# Patient Record
Sex: Male | Born: 1990
Health system: Southern US, Community
[De-identification: ages and names within clinical notes are randomized; demographics above are authoritative.]

## PROBLEM LIST (undated history)

## (undated) HISTORY — PX: SHOULDER SURGERY: SHX246

---
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
## 2008-06-04 ENCOUNTER — Ambulatory Visit (HOSPITAL_COMMUNITY): Admission: RE | Admit: 2008-06-04 | Discharge: 2008-06-04 | Payer: Self-pay | Admitting: Orthopedic Surgery

## 2011-05-02 NOTE — Op Note (Signed)
Ryan Kramer, Ryan Kramer               ACCOUNT NO.:  [id number]   MEDICAL RECORD NO.:  [id number]          PATIENT TYPE:  AMB   LOCATION:  SDS                          FACILITY:  [facility]   PHYSICIAN:  Vania Rea. Supple, M.D.  DATE OF BIRTH:  05-Jan-1991   DATE OF PROCEDURE:  08/08/2007  DATE OF DISCHARGE:                               OPERATIVE REPORT   PREOPERATIVE DIAGNOSIS:  Recurrent right shoulder instability, question  posterior verus anterior versus multidirectional instability.   POSTOPERATIVE DIAGNOSES:  1. Recurrent right shoulder instability (anterior).  2. Partial articular rotator cuff tear.   PROCEDURE:  1. Right shoulder examination under anesthesia.  2. Right shoulder diagnostic arthroscopy.  3. Arthroscopic Bankart repair.  4. Debridement of partial articular rotator cuff tear.   SURGEON:  Vania Rea. Supple, M.D.   ASSISTANTFrench Ana Shuford, P.A.-C.   ANESTHESIA:  General endotracheal as well as a preoperative interscalene  block.   BLOOD LOSS:  Minimal.   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  His examination has shown questionable  anterior versus posterior instability versus MDI.  He played all last  year with bilateral shoulder harnesses and did relatively well but is  now having increasing difficulties with bilateral shoulder instability.  The right is more symptomatic.  He is brought to the operating room at  this time for planned right shoulder arthroscopic stabilization.   We preoperatively counseled Ryan Kramer and his mother on treatment options  as well as the risks versus benefits thereof.  Possible surgical  complications of bleeding, infection, neurovascular injury, persistent  pain, loss of motion, anesthetic complications, recurrence of  instability, and possible need for additional surgery were reviewed.  They understand and  accept and agree with our planned procedure.   PROCEDURE IN DETAIL:  After undergoing routine preoperative evaluation,  the patient received prophylactic antibiotics.  An interscalene block  was established in the holding area by the anesthesia department.  He  was placed supine on the operating table and underwent smooth induction  of a general endotracheal anesthesia.  He was turned to the left lateral  decubitus position on a beanbag and appropriately padded and protected.   Right shoulder examination under anesthesia showed posterior translation  at the upper limits of normal, but there was no gross posterior  instability.  There was, however, obvious anterior instability with  ability to easily dislocate the shoulder anteriorly on subsequent  reduction maneuver.  The right arm was then suspended at 70 degrees of  abduction with 10 pounds of traction.  The right shoulder girdle region  was then sterilely prepped and draped in standard fashion.  A posterior  portal was established in the glenohumeral joint and an anterior portal  was established under direct visualization.  The glenohumeral articular  surfaces showed areas of chondromalacia and chondral injury over the  posterior and superior aspect of the humeral head consistent with a Hill-  Sachs lesion, although there was no obvious indentation on the humeral  head.  The glenoid was in good condition, although there was evidence  for attenuation along the anterior margin of the glenoid, and the  anterior-inferior labrum was markedly deficient.  In addition, the  anterior- inferior glenohumeral ligaments were significantly attenuated,  stretched, and there was a very capacious anterior inferior pouch.  The humeral head readily dislocated anteriorly.  There was no obvious  posterior instability, although again, the capsular volume was at the  upper limits of normal.  There was a large pedunculated fragment of torn  anterior labral  tissue which was debrided with a shaver.  The biceps  tendon was of normal caliber and good-quality tissue.  There was no  obvious instability of the biceps anchor.  Inspection of the rotator  cuff showed an area of fraying on the articular side involving the  distal insertion of the supraspinatus.  This was a partial-thickness  tear comprising approximately 5 to 10% of the thickness of the tendon.  This area was debrided with a shaver.  The posterior and anterior  aspects of the rotator cuff were in good condition.  Probing of the  anterior labrum showed that it had attenuated and was detached from 2  o'clock down to 6 o'clock.  It had scarified medially on the glenoid  neck.  With this finding, we proceeded with a Bankart repair.  A  periosteal elevator was introduced and used to elevate the anterior  labrum and adjacent aspects of the anterior inferior glenohumeral  ligaments away from the glenoid rim and neck.  It was completely  mobilized.  A bur was then introduced and used to abrade the anterior  margin of the glenoid face from 2 o'clock down to 6 o'clock.  The shaver  was then used to remove all residual bony and soft tissue debris.  An  accessory anterior inferior portal was then established through the  upper margin of the subscapularis.  We placed a series of 3 Arthrex Bio-  FASTak suture anchors at the 5:30, 4:30, and 3 o'clock position.  The  suture limbs were then passed through the adjacent margins of the  anterior-inferior glenohumeral ligament utilizing initially a suture  shuttle technique with the corkscrew suture lasso, and this allowed [place]  to grab suture from very inferiorly and bring it up to the glenoid bone  trough.  The suture limbs were passed in a horizontal mattress construct  and then tied with sliding locking knots followed by multiple overhand  throws and alternating posts.  This allowed a nice re-establishment of  the anterior labral bumper and also reduce  the redundant anterior  capsular volume, and this also helped to center the humeral head over  the glenoid which was nicely concentric at the completion of the  procedure.  Final debridement was performed with the shaver.  At this  point, the fluid and instrument were then removed.  The ports were  closed with Monocryl and Steri-Strips.  A bulky dry dressing taped out  the right shoulder.  The right arm was placed in a sling immobilizer.   The patient was placed supine, extubated, and taken to the recovery room  in stable condition.      Vania Rea. Supple, M.D.  Electronically Signed     KMS/MEDQ  D:  08/08/2007  T:  08/09/2007  Job:  [id number]

## 2011-05-02 NOTE — Op Note (Signed)
NAMESHAHEEM, Ryan Kramer               ACCOUNT NO.:  [id number]   MEDICAL RECORD NO.:  [id number]          PATIENT TYPE:  AMB   LOCATION:  SDS                          FACILITY:  [facility]   PHYSICIAN:  Vania Rea. Supple, M.D.  DATE OF BIRTH:  [birth year]   DATE OF PROCEDURE:  06/04/2008  DATE OF DISCHARGE:  06/04/2008                               OPERATIVE REPORT   PREOPERATIVE DIAGNOSIS:  Recurrent left shoulder anterior instability.   POSTOPERATIVE DIAGNOSES:  1. Recurrent left shoulder anterior instability.  2. Partial articular rotator cuff tear.   PROCEDURE:  1. Left shoulder examination under anesthesia.  2. Left shoulder diagnostic arthroscopy.  3. Arthroscopic Bankart repair.  4. Debridement partial articular rotator cuff tear.   SURGEON:  Vania Rea. Supple, MD   ASSISTANT:  Lucita Lora. Shuford, PA-C.   ANESTHESIA:  General endotracheal as well as preoperative scalene block.   ESTIMATED BLOOD LOSS:  Minimal.   DRAINS:  None.   HISTORY:  Ryan Kramer is an 20 year old male who has had difficulties with  recurrent bilateral shoulder instability, underwent a right shoulder  arthroscopic stabilization that I performed just over a year ago.  He  has done very well and is now brought to the operating room for planned  left shoulder arthroscopic stabilization.   Preoperatively, I counseled Ryan Kramer and his family on treatment options  as well as risks versus benefits thereof.  Possible surgical  complications, bleeding, infection, neurovascular injury, persistent  pain, loss of motion, recurrent instability, and possible need for  additional surgery were reviewed.  They understands, accepts, and agrees  with our planned procedure.   PROCEDURE IN DETAIL:  After undergoing routine preop evaluation, the  patient received prophylactic antibiotics.  Interscalene block was  established in a holding area by the Anesthesia Department.  Placed  supine on the operative table and underwent smooth  induction of a  general endotracheal anesthesia.  Turned to the right lateral decubitus  position on a beanbag and appropriately padded and protected.  Left  shoulder examination under anesthesia revealed anterior instability.  Shoulder mobility was at the limits of normal, but no obvious pathologic  posterior instability.  Left arm suspended at 70 degrees of abduction  with 15 pounds of traction.  Left shoulder girdle region was then  sterilely prepped and draped in standard fashion.  Posterior portal was  established in the glenohumeral joint and anterior portal established  under direct visualization.  The glenohumeral articular surfaces were in  excellent condition.  The capsular volume posteriorly was the upper  limits of normal, but there was no obvious pathologic posterior or  inferior instability.  There was a combined anterior inferior  instability and an obvious Bankart lesion with disruption and detachment  of the anterior inferior quadrant of the labrum and deficiency of the  anterior band at the inferior labral ligament consistent with classic  Bankart lesion.  In addition, we found evidence for partial articular  rotator cuff tear involving the distal supraspinatus.  This area was  debrided with a shaver to healthy-appearing tissue.  The estimated  involved less than 5-10%  thickness of the rotator cuff tendon.  The  superior labrum was carefully inspected and found to be stable and the  biceps tendon showed normal caliber, good quality tissue.  This point,  we proceed with a Bankart repair, beginning with a periosteal elevator  to elevate and free up the anterior-inferior quadrant of the labrum and  attached glenohumeral ligaments.  These were completely mobilized from  10 o'clock down to 6 o'clock.  We then used a bur to ablate the anterior  margin of the glenoid face from 10 o'clock down to 6 o'clock and then  debrided soft tissues with a shaver.  We then established an  accessory  anterior inferior cannula through the upper margin of the subscapularis.  We then placed a series of 3 Arthrex PEEK suture tacks at the 7 o'clock,  8:30 and 10 o'clock position.  The limbs of the suture anchor were then  passed through the adjacent aspects of the labrum and glenohumeral  ligaments beginning with the inferior pair which was placed in such a  way as to allow a superior ward shift of the redundant axillary pouch  and also reestablish a anterior labral bumper and the suture limbs  were passed in a horizontal mattress construct, then tied with sliding  and locking knots followed by multiple overhand throws and alternating  posts.  Once the sutures were placed and completed this nicely  eliminating redundant capsular volume inferiorly and anteriorly and  reestablish an excellent anterior capsule bumper.  At this point,  final inspection, irrigation, and debridement was then completed.  Fluid  and instruments were removed.  The portals were closed with Monocryl and  Steri-Strips.  A bulky dry dressing was then taped over the left total  shoulder, left arm placed in sling immobilizer.  The patient was then  placed supine, extubated, and taken to recovery room in stable  condition.      Vania Rea. Supple, M.D.  Electronically Signed     KMS/MEDQ  D:  06/04/2008  T:  06/05/2008  Job:  [id number]

## 2011-09-14 LAB — COMPREHENSIVE METABOLIC PANEL WITH GFR
ALT: 17
AST: 17
Albumin: 4.1
Alkaline Phosphatase: 74
BUN: 11
CO2: 28
Calcium: 9.5
Chloride: 107
Creatinine, Ser: 1.09
GFR calc Af Amer: >60
GFR calc non Af Amer: >60
Glucose, Bld: 98
Potassium: 4
Sodium: 141
Total Bilirubin: 1
Total Protein: 6.7

## 2011-09-14 LAB — CBC
HCT: 44.3
Hemoglobin: 15.3
MCHC: 34.6
MCV: 90.3
Platelets: 183
RBC: 4.91
RDW: 12.5
WBC: 9.1

## 2011-09-14 LAB — PROTIME-INR
INR: 1.1
Prothrombin Time: 14.9

## 2011-09-14 LAB — URINALYSIS, ROUTINE W REFLEX MICROSCOPIC
Nitrite: NEGATIVE
Protein, ur: NEGATIVE
Specific Gravity, Urine: 1.024
Urobilinogen, UA: 1

## 2011-09-14 LAB — APTT: aPTT: 30

## 2011-09-29 LAB — DIFFERENTIAL
Basophils Absolute: 0
Basophils Relative: 0
Neutro Abs: 5.1
Neutrophils Relative %: 64

## 2011-09-29 LAB — COMPREHENSIVE METABOLIC PANEL
Alkaline Phosphatase: 88
BUN: 11
Chloride: 104
Glucose, Bld: 89
Potassium: 3.9
Total Bilirubin: 0.7
Total Protein: 7.1

## 2011-09-29 LAB — PROTIME-INR
INR: 1.1
Prothrombin Time: 14

## 2011-09-29 LAB — CBC
RBC: 4.92
RDW: 12.4
WBC: 7.9

## 2011-09-29 LAB — URINALYSIS, ROUTINE W REFLEX MICROSCOPIC
Bilirubin Urine: NEGATIVE
Ketones, ur: NEGATIVE
Nitrite: NEGATIVE
Urobilinogen, UA: 0.2

## 2011-09-29 LAB — APTT: aPTT: 28

## 2012-02-07 ENCOUNTER — Emergency Department (INDEPENDENT_AMBULATORY_CARE_PROVIDER_SITE_OTHER): Payer: BC Managed Care – PPO

## 2012-02-07 ENCOUNTER — Encounter (HOSPITAL_COMMUNITY): Payer: Self-pay

## 2012-02-07 ENCOUNTER — Emergency Department (HOSPITAL_COMMUNITY)
Admission: EM | Admit: 2012-02-07 | Discharge: 2012-02-07 | Disposition: A | Discharge status: Home Health | Payer: BC Managed Care – PPO | Source: Home / Self Care

## 2012-02-07 DIAGNOSIS — S93409A Sprain of unspecified ligament of unspecified ankle, initial encounter: Secondary | ICD-10-CM

## 2012-02-07 MED ORDER — IBUPROFEN 800 MG PO TABS: 800.0000 mg | ORAL_TABLET | Freq: Three times a day (TID) | ORAL | Status: AC

## 2012-02-07 NOTE — ED Notes (Signed)
States he turned his foot last PM, and has pain lateral aspect of right foot ; denies pain ankle, good posterior tibial and dorsal pedal aspect of injured foot , ambulates on crutches, no significant swelling appreciated on exam

## 2012-02-07 NOTE — Discharge Instructions (Signed)
Ice your ankle and foot 3-4 times a day for 15-20 mintues. Elevate also to help with swelling and discomfort. Use crutches as needed. You can begin to bear weight on your foot as tolerated. If you are not noticing improvement in one week follow up with Dr Renae Gloss.

## 2012-02-07 NOTE — ED Provider Notes (Signed)
Medical screening examination/treatment/procedure(s) were performed by non-physician practitioner and as supervising physician I was immediately available for consultation/collaboration.  Corrie Mckusick, MD 02/07/12 [phone]

## 2012-02-07 NOTE — ED Provider Notes (Signed)
History     CSN: [id number]  Arrival date & time 02/07/12  4098   None     Chief Complaint  Patient presents with  . Foot Injury    (Consider location/radiation/quality/duration/timing/severity/associated sxs/prior treatment) HPI Comments: Patient presents today with complaints of right lateral foot pain. He states he was playing basketball last night, when he jumped and landed rolling his right ankle. Pain worsens with weight bearing. He is using crutches. He also notes swelling of his right foot.    History reviewed. No pertinent past medical history.  History reviewed. No pertinent past surgical history.  History reviewed. No pertinent family history.  History  Substance Use Topics  . Smoking status: Never Smoker   . Smokeless tobacco: Not on file  . Alcohol Use: Yes      Review of Systems  Musculoskeletal: Negative for joint swelling.  Skin: Negative for color change and wound.  Neurological: Negative for weakness and numbness.    Allergies  Review of patient's allergies indicates no known allergies.  Home Medications   Current Outpatient Rx  Name Route Sig Dispense Refill  . IBUPROFEN 800 MG PO TABS Oral Take 1 tablet (800 mg total) by mouth 3 (three) times daily. 15 tablet 0    BP 134/76  Pulse 67  Temp(Src) 98.1 F (36.7 C) (Oral)  Resp 17  SpO2 97%  Physical Exam  Nursing note and vitals reviewed. Constitutional: He appears well-developed and well-nourished. No distress.  HENT:  Head: Normocephalic and atraumatic.  Right Ear: Tympanic membrane and ear canal normal.  Left Ear: Tympanic membrane and ear canal normal.  Mouth/Throat: Uvula is midline and mucous membranes are normal. No posterior oropharyngeal edema or posterior oropharyngeal erythema.  Musculoskeletal:       Right ankle: He exhibits normal range of motion, no swelling, no ecchymosis, no deformity and normal pulse. tenderness. AITFL and head of 5th metatarsal tenderness found. No  lateral malleolus, no medial malleolus, no CF ligament, no posterior TFL and no proximal fibula tenderness found. Achilles tendon normal.       Right foot: He exhibits bony tenderness. He exhibits normal range of motion, no tenderness, no swelling, normal capillary refill, no crepitus, no deformity and no laceration.  Neurological: He is alert.  Skin: Skin is warm and dry.  Psychiatric: He has a normal mood and affect.    ED Course  Procedures (including critical care time)  Labs Reviewed - No data to display Dg Foot Complete Right  02/07/2012  *RADIOLOGY REPORT*  Clinical Data: 21 year old male with twisting injury and pain at the lateral foot.  RIGHT FOOT COMPLETE - 3+ VIEW  Comparison: None.  Findings: Calcaneus intact.  Joint spaces within normal limits.  No acute fracture or dislocation. Bone mineralization is within normal limits.  IMPRESSION: No acute fracture or dislocation identified about the right foot.  Original Report Authenticated By: Ulla Potash III, M.D.     1. Ankle sprain       MDM  Xray neg.         Melody Comas, [place] 02/07/12 1324

## 2014-12-14 ENCOUNTER — Encounter (HOSPITAL_COMMUNITY): Payer: Self-pay

## 2014-12-14 ENCOUNTER — Emergency Department (INDEPENDENT_AMBULATORY_CARE_PROVIDER_SITE_OTHER)
Admission: EM | Admit: 2014-12-14 | Discharge: 2014-12-14 | Disposition: A | Discharge status: Home / Self Care | Payer: 59 | Source: Home / Self Care | Attending: Family Medicine | Admitting: Family Medicine

## 2014-12-14 DIAGNOSIS — R519 Headache, unspecified: Secondary | ICD-10-CM

## 2014-12-14 DIAGNOSIS — R51 Headache: Secondary | ICD-10-CM

## 2014-12-14 MED ORDER — KETOROLAC TROMETHAMINE 60 MG/2ML IM SOLN
INTRAMUSCULAR | Status: AC
  Filled 2014-12-14: qty 2

## 2014-12-14 MED ORDER — DIPHENHYDRAMINE HCL 25 MG PO CAPS
50.0000 mg | ORAL_CAPSULE | Freq: Once | ORAL | Status: AC
  Administered 2014-12-14: 50 mg via ORAL

## 2014-12-14 MED ORDER — METHYLPREDNISOLONE SODIUM SUCC 125 MG IJ SOLR
125.0000 mg | Freq: Once | INTRAMUSCULAR | Status: AC
  Administered 2014-12-14: 125 mg via INTRAMUSCULAR

## 2014-12-14 MED ORDER — DIPHENHYDRAMINE HCL 25 MG PO CAPS
ORAL_CAPSULE | ORAL | Status: AC
  Filled 2014-12-14: qty 2

## 2014-12-14 MED ORDER — KETOROLAC TROMETHAMINE 60 MG/2ML IM SOLN
60.0000 mg | Freq: Once | INTRAMUSCULAR | Status: AC
  Administered 2014-12-14: 60 mg via INTRAMUSCULAR

## 2014-12-14 MED ORDER — METHYLPREDNISOLONE SODIUM SUCC 125 MG IJ SOLR
INTRAMUSCULAR | Status: AC
  Filled 2014-12-14: qty 2

## 2014-12-14 MED ORDER — METOCLOPRAMIDE HCL 5 MG/ML IJ SOLN
INTRAMUSCULAR | Status: AC
  Filled 2014-12-14: qty 2

## 2014-12-14 MED ORDER — METOCLOPRAMIDE HCL 5 MG/ML IJ SOLN
10.0000 mg | Freq: Once | INTRAMUSCULAR | Status: AC
  Administered 2014-12-14: 10 mg via INTRAMUSCULAR

## 2014-12-14 NOTE — ED Provider Notes (Addendum)
CSN: [id number]     Arrival date & time 12/14/14  1854 History   First MD Initiated Contact with Patient 12/14/14 1925     Chief Complaint  Patient presents with  . Headache   (Consider location/radiation/quality/duration/timing/severity/associated sxs/prior Treatment) Patient is a 23 y.o. male presenting with headaches. The history is provided by the patient. No language interpreter was used.  Headache Pain location:  Frontal Radiates to:  Does not radiate Severity currently:  6/10 Severity at highest:  6/10 Onset quality:  Gradual Duration:  3 days Timing:  Constant Progression:  Worsening Chronicity:  New Similar to prior headaches: no   Context: not eating   Relieved by:  Nothing Worsened by:  Nothing tried Ineffective treatments:  None tried Associated symptoms: nausea   Risk factors: no anger and no family hx of SAH     History reviewed. No pertinent past medical history. History reviewed. No pertinent past surgical history. History reviewed. No pertinent family history. History  Substance Use Topics  . Smoking status: Never Smoker   . Smokeless tobacco: Not on file  . Alcohol Use: Yes    Review of Systems  Gastrointestinal: Positive for nausea.  Neurological: Positive for headaches.  All other systems reviewed and are negative.   Allergies  Review of patient's allergies indicates no known allergies.  Home Medications   Prior to Admission medications   Not on File   BP 139/78 mmHg  Pulse 74  Temp(Src) 98.6 F (37 C) (Oral)  Resp 16  SpO2 98% Physical Exam  Constitutional: He is oriented to person, place, and time. He appears well-developed and well-nourished.  HENT:  Head: Normocephalic.  Right Ear: External ear normal.  Left Ear: External ear normal.  Mouth/Throat: Oropharynx is clear and moist.  Eyes: Conjunctivae and EOM are normal. Pupils are equal, round, and reactive to light.  Neck: Normal range of motion.  Cardiovascular: Normal rate  and normal heart sounds.   Pulmonary/Chest: Effort normal and breath sounds normal.  Abdominal: Soft. He exhibits no distension.  Musculoskeletal: Normal range of motion.  Neurological: He is alert and oriented to person, place, and time.  Psychiatric: He has a normal mood and affect.  Nursing note and vitals reviewed.   ED Course  Procedures (including critical care time) Labs Review Labs Reviewed - No data to display  Imaging Review No results found.   MDM  Pt given Wake Migraine cocktail, reglan 10 mg, torodol 60 mg, solumedrol125  and benadryl 25. Pt advised home to sleep.  To ED if symptoms worsen or cahnge   1. Headache, unspecified headache type        Elson AreasLeslie K Jamahl Lemmons, PA-C 12/14/14 [address]2008  Almeta Geisel K Wilton ManorsSofia, [place] JerseyPA-C 01/05/15 1202

## 2014-12-14 NOTE — ED Notes (Signed)
3rd day duration of HA. No relief w motrin . Has a driver. Looks uncomfortable

## 2014-12-14 NOTE — Discharge Instructions (Signed)

## 2015-05-28 ENCOUNTER — Encounter (HOSPITAL_COMMUNITY): Payer: Self-pay | Admitting: Emergency Medicine

## 2015-05-28 ENCOUNTER — Emergency Department (HOSPITAL_COMMUNITY)
Admission: EM | Admit: 2015-05-28 | Discharge: 2015-05-28 | Disposition: A | Discharge status: Home / Self Care | Payer: 59 | Source: Home / Self Care | Attending: Emergency Medicine | Admitting: Emergency Medicine

## 2015-05-28 ENCOUNTER — Emergency Department (INDEPENDENT_AMBULATORY_CARE_PROVIDER_SITE_OTHER): Payer: 59

## 2015-05-28 DIAGNOSIS — S63502A Unspecified sprain of left wrist, initial encounter: Secondary | ICD-10-CM

## 2015-05-28 MED ORDER — HYDROCODONE-ACETAMINOPHEN 5-325 MG PO TABS: 1.0000 | ORAL_TABLET | Freq: Four times a day (QID) | ORAL | Status: DC | PRN

## 2015-05-28 MED ORDER — IBUPROFEN 800 MG PO TABS: 800.0000 mg | ORAL_TABLET | Freq: Three times a day (TID) | ORAL | Status: DC | PRN

## 2015-05-28 NOTE — Discharge Instructions (Signed)
Your x-ray is normal. You have sprained your wrist. Wear the brace during the day. Remove it several times and do gentle range of motion. Continue to ice the wrist. Take ibuprofen 800 mg 3 times a day for the next 5 days, then as needed. Use the Norco every 4-6 hours as needed for severe pain. Do not drive while taking this medicine. If there is no improvement in 1 week, please follow-up with your orthopedic doctor.

## 2015-05-28 NOTE — ED Provider Notes (Signed)
CSN: [id number]     Arrival date & time 05/28/15  1634 History   First MD Initiated Contact with Patient 05/28/15 1640     Chief Complaint  Patient presents with  . Wrist Injury   (Consider location/radiation/quality/duration/timing/severity/associated sxs/prior Treatment) HPI  He is a 24 year old man here for evaluation of left wrist injury. 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 He landed on his outstretched left hand. He reports immediate pain and swelling in the wrist. The pain is worse on the ulnar side of the wrist. He has been applying ice, which has helped the swelling. He has tried ibuprofen and hydrocodone with minimal improvement.  History reviewed. No pertinent past medical history. History reviewed. No pertinent past surgical history. No family history on file. History  Substance Use Topics  . Smoking status: Never Smoker   . Smokeless tobacco: Not on file  . Alcohol Use: Yes    Review of Systems As in history of present illness Allergies  Review of patient's allergies indicates no known allergies.  Home Medications   Prior to Admission medications   Medication Sig Start Date End Date Taking? Authorizing Provider  HYDROcodone-acetaminophen (NORCO) 5-325 MG per tablet Take 1 tablet by mouth every 6 (six) hours as needed for moderate pain. 05/28/15   Charm Rings, MD  ibuprofen (ADVIL,MOTRIN) 800 MG tablet Take 1 tablet (800 mg total) by mouth every 8 (eight) hours as needed for moderate pain. 05/28/15   Charm Rings, MD   BP 121/77 mmHg  Pulse 67  Temp(Src) 98.2 F (36.8 C) (Oral)  Resp 16  SpO2 98% Physical Exam  Constitutional: He is oriented to person, place, and time. He appears well-developed and well-nourished. No distress.  Cardiovascular: Normal rate.   Pulmonary/Chest: Effort normal.  Musculoskeletal:  Left wrist: Mild swelling. He is diffusely tender at the wrist, primarily on the dorsal  aspect. He is able to move his fingers, but this causes some pain and discomfort. Any movement of the wrist is painful. He has a 2+ radial pulse.  Neurological: He is alert and oriented to person, place, and time.    ED Course  Procedures (including critical care time) Labs Review Labs Reviewed - No data to display  Imaging Review Dg Wrist Complete Left  05/28/2015   CLINICAL DATA:  Status post fall yesterday with persistent left wrist pain and limited range of motion  EXAM: LEFT WRIST - COMPLETE 3+ VIEW  COMPARISON:  None  FINDINGS: The bones of the left wrist are adequately mineralized. There is no acute fracture nor dislocation. Specific attention to the scaphoid reveals no acute abnormality. There is mild soft tissue swelling dorsally.  IMPRESSION: There is no acute bony abnormality of the left wrist.   Electronically Signed   By: David  [place] M.D.   On: 05/28/2015 17:18     MDM   1. Left wrist sprain, initial encounter    X-ray negative for fracture. Conservative management with brace, ice, ibuprofen, Norco. If no improvement in 1 week, he will follow-up with his orthopedic doctor.    Charm Rings, MD 05/28/15 [phone]

## 2015-05-28 NOTE — ED Notes (Signed)
Patient c/o fall yesterday through a outdoor ramp. Patient reports that he has pain in his left wrist. And also has abrasions on his right knee. Patient is in NAD.

## 2016-09-01 MED FILL — LEVOCETIRIZINE 5 MG TABLET: 5 | 30 days supply | Qty: 30 | Fill #0

## 2016-09-04 ENCOUNTER — Ambulatory Visit
Admission: RE | Admit: 2016-09-04 | Discharge: 2016-09-04 | Disposition: A | Discharge status: Home / Self Care | Payer: No Typology Code available for payment source | Source: Ambulatory Visit | Attending: Internal Medicine | Admitting: Internal Medicine

## 2016-09-04 ENCOUNTER — Other Ambulatory Visit: Payer: Self-pay | Admitting: Internal Medicine

## 2016-09-04 DIAGNOSIS — R05 Cough: Secondary | ICD-10-CM

## 2016-09-04 DIAGNOSIS — R053 Chronic cough: Secondary | ICD-10-CM

## 2019-09-10 ENCOUNTER — Other Ambulatory Visit: Payer: Self-pay

## 2019-09-10 DIAGNOSIS — Z20822 Contact with and (suspected) exposure to covid-19: Secondary | ICD-10-CM

## 2019-09-12 LAB — NOVEL CORONAVIRUS, NAA: SARS-CoV-2, NAA: NOT DETECTED

## 2019-09-18 ENCOUNTER — Other Ambulatory Visit: Payer: Self-pay

## 2019-09-18 ENCOUNTER — Ambulatory Visit
Admission: EM | Admit: 2019-09-18 | Discharge: 2019-09-18 | Disposition: A | Discharge status: Home / Self Care | Payer: Self-pay | Attending: Emergency Medicine | Admitting: Emergency Medicine

## 2019-09-18 DIAGNOSIS — R42 Dizziness and giddiness: Secondary | ICD-10-CM

## 2019-09-18 DIAGNOSIS — R002 Palpitations: Secondary | ICD-10-CM

## 2019-09-18 LAB — POCT FASTING CBG KUC MANUAL ENTRY: POCT Glucose (KUC): 106 mg/dL — AB (ref 70–99)

## 2019-09-18 MED ORDER — MECLIZINE HCL 25 MG PO TABS: 25.0000 mg | ORAL_TABLET | Freq: Three times a day (TID) | ORAL | 0 refills | Status: DC | PRN

## 2019-09-18 NOTE — ED Triage Notes (Signed)
Pt presents to UC w/ c/o dizzy spells, cold chills, feeling weak, excessive sweating at night x1 week. Pt reports it occurs in middle of day as well.

## 2019-09-18 NOTE — ED Provider Notes (Signed)
[facility]    CSN: [id number] Arrival date & time: 09/18/19  1036      History   Chief Complaint Chief Complaint  Patient presents with  . Dizziness    HPI Ryan Kramer is a 28 y.o. male presenting for 1 week course of intermittent, paroxysmal dizziness, palpitations, cold chills, generalized weakness, night sweats.  Patient denies recent illness, fever, known sick contacts, recent travel, bug/animal bites.  No URI, GI, cardiopulmonary symptoms.  Patient is unable to articulate exacerbating, alleviating symptoms.  Overall, history is limited second to patient's scattered history, though symptoms do not appear to overlap.  Patient denies history of chronic medical disease.    History reviewed. No pertinent past medical history.  There are no active problems to display for this patient.   Past Surgical History:  Procedure Laterality Date  . SHOULDER SURGERY         Home Medications    Prior to Admission medications   Medication Sig Start Date End Date Taking? Authorizing Provider  meclizine (ANTIVERT) 25 MG tablet Take 1 tablet (25 mg total) by mouth 3 (three) times daily as needed for dizziness. 09/18/19   Hall-Potvin, [place], PA-C    Family History Family History  Problem Relation Age of Onset  . Hypertension Mother   . Hypertension Father     Social History Social History   Tobacco Use  . Smoking status: Current Every Day Smoker    Types: Cigars  . Smokeless tobacco: Never Used  . Tobacco comment: once a week  Substance Use Topics  . Alcohol use: Yes    Comment: 2-3 drinks a week  . Drug use: No     Allergies   Patient has no known allergies.   Review of Systems Review of Systems  Constitutional: Positive for chills. Negative for activity change, appetite change, diaphoresis, fatigue, fever and unexpected weight change.  HENT: Negative for congestion, dental problem, ear pain, facial swelling, hearing loss, sinus pain, sore  throat, trouble swallowing and voice change.   Eyes: Negative for photophobia, pain and visual disturbance.  Respiratory: Negative for cough, shortness of breath and wheezing.   Cardiovascular: Negative for chest pain, palpitations and leg swelling.  Gastrointestinal: Negative for abdominal pain, diarrhea, nausea and vomiting.  Musculoskeletal: Negative for arthralgias, back pain, myalgias and neck pain.  Skin: Negative for rash and wound.  Neurological: Positive for dizziness. Negative for tremors, seizures, syncope, facial asymmetry, speech difficulty, weakness, light-headedness, numbness and headaches.  Hematological: Negative for adenopathy. Does not bruise/bleed easily.  Psychiatric/Behavioral: Negative for agitation and sleep disturbance. The patient is not nervous/anxious.   All other systems reviewed and are negative.    Physical Exam Triage Vital Signs ED Triage Vitals  Enc Vitals Group     BP 09/18/19 1047 119/87     Pulse Rate 09/18/19 1047 87     Resp 09/18/19 1047 16     Temp 09/18/19 1047 97.9 F (36.6 C)     Temp Source 09/18/19 1047 Oral     SpO2 09/18/19 1047 95 %     Weight --      Height --      Head Circumference --      Peak Flow --      Pain Score 09/18/19 1051 2     Pain Loc --      Pain Edu? --      Excl. in [place]? --    No data found.  Updated Vital Signs BP  119/87 (BP Location: Left Arm)   Pulse 87   Temp 97.9 F (36.6 C) (Oral)   Resp 16   SpO2 95%    Physical Exam Constitutional:      General: He is not in acute distress.    Appearance: He is obese. He is not ill-appearing.  HENT:     Head: Normocephalic and atraumatic.     Right Ear: Tympanic membrane, ear canal and external ear normal.     Left Ear: Tympanic membrane, ear canal and external ear normal.     Nose: Nose normal.     Mouth/Throat:     Mouth: Mucous membranes are moist.     Pharynx: Oropharynx is clear.  Eyes:     General: No scleral icterus.    Pupils: Pupils are equal,  round, and reactive to light.  Neck:     Musculoskeletal: Normal range of motion. No muscular tenderness.  Cardiovascular:     Rate and Rhythm: Normal rate and regular rhythm.     Heart sounds: No murmur. No gallop.   Pulmonary:     Effort: Pulmonary effort is normal. No respiratory distress.     Breath sounds: No wheezing or rales.  Abdominal:     General: Bowel sounds are normal. There is no distension.     Tenderness: There is no abdominal tenderness.  Musculoskeletal: Normal range of motion.     Right lower leg: No edema.     Left lower leg: No edema.  Lymphadenopathy:     Cervical: No cervical adenopathy.  Skin:    General: Skin is warm.     Capillary Refill: Capillary refill takes less than 2 seconds.     Coloration: Skin is not jaundiced or pale.     Findings: No rash.  Neurological:     General: No focal deficit present.     Mental Status: He is alert and oriented to person, place, and time.     Cranial Nerves: No cranial nerve deficit.     Sensory: No sensory deficit.     Motor: No weakness.     Coordination: Coordination normal.     Gait: Gait normal.     Deep Tendon Reflexes: Reflexes normal.     Comments: Negative head jerk test.  Dizziness not reproduced throughout exam  Psychiatric:     Comments: Patient is minimally engaged with provider during visit.  Speech is not pressured, thoughts are coherent.  Patient is articulate.      UC Treatments / Results  Labs (all labs ordered are listed, but only abnormal results are displayed) Labs Reviewed  POCT FASTING CBG [facility] MANUAL ENTRY - Abnormal; Notable for the following components:      Result Value   POCT Glucose ([facility]) 106 (*)    All other components within normal limits    EKG   Radiology No results found.  Procedures Procedures (including critical care time)  Medications Ordered in UC Medications - No data to display  Initial Impression / Assessment and Plan / UC Course  I have reviewed the  triage vital signs and the nursing notes.  Pertinent labs & imaging results that were available during my care of the patient were reviewed by me and considered in my medical decision making (see chart for details).     Patient without significant medical history with scattered 1 week history of paroxysmal dizziness, palpitations that are nonexertional.  No neurocognitive deficits on exam.  Patient did not eat/drink so far today:  CBG 106.  EKG done in office, reviewed by me without previous to compare: Normal sinus rhythm without QTC prolongation, ST elevation, waveform abnormality.  Provided reassurance to patient satisfaction.  Patient has PCP appointment on 10/6, intends to keep this: Will trial meclizine in the interim.  Return precautions discussed, patient verbalized understanding and is agreeable to plan. Final Clinical Impressions(s) / UC Diagnoses   Final diagnoses:  Dizziness  Palpitations     Discharge Instructions     Important to drink plenty water throughout the day. Keep your appointment on 10/6 with primary care for further evaluation/management. May try meclizine as needed for dizziness. Return for worsening symptoms, loss of consciousness, chest pain, shortness of breath.    ED Prescriptions    Medication Sig Dispense Auth. Provider   meclizine (ANTIVERT) 25 MG tablet Take 1 tablet (25 mg total) by mouth 3 (three) times daily as needed for dizziness. 30 tablet Hall-Potvin, [place], PA-C     PDMP not reviewed this encounter.   Hall-Potvin, [place], [place]-C 09/18/19 1729

## 2019-09-18 NOTE — Discharge Instructions (Addendum)
Important to drink plenty water throughout the day. Keep your appointment on 10/6 with primary care for further evaluation/management. May try meclizine as needed for dizziness. Return for worsening symptoms, loss of consciousness, chest pain, shortness of breath.

## 2019-09-24 ENCOUNTER — Other Ambulatory Visit: Payer: Self-pay

## 2019-09-24 ENCOUNTER — Encounter: Payer: Self-pay | Admitting: Emergency Medicine

## 2019-09-24 ENCOUNTER — Ambulatory Visit (INDEPENDENT_AMBULATORY_CARE_PROVIDER_SITE_OTHER): Payer: Self-pay

## 2019-09-24 ENCOUNTER — Ambulatory Visit (INDEPENDENT_AMBULATORY_CARE_PROVIDER_SITE_OTHER): Payer: Self-pay | Admitting: Emergency Medicine

## 2019-09-24 VITALS — BP 119/79 | HR 104 | Temp 98.5°F | Resp 16 | Ht 72.0 in | Wt 329.8 lb

## 2019-09-24 DIAGNOSIS — R42 Dizziness and giddiness: Secondary | ICD-10-CM

## 2019-09-24 DIAGNOSIS — R059 Cough, unspecified: Secondary | ICD-10-CM

## 2019-09-24 DIAGNOSIS — R531 Weakness: Secondary | ICD-10-CM

## 2019-09-24 DIAGNOSIS — R29818 Other symptoms and signs involving the nervous system: Secondary | ICD-10-CM

## 2019-09-24 DIAGNOSIS — R05 Cough: Secondary | ICD-10-CM

## 2019-09-24 DIAGNOSIS — R5381 Other malaise: Secondary | ICD-10-CM

## 2019-09-24 DIAGNOSIS — R5383 Other fatigue: Secondary | ICD-10-CM

## 2019-09-24 DIAGNOSIS — R0683 Snoring: Secondary | ICD-10-CM

## 2019-09-24 NOTE — Patient Instructions (Addendum)
     If you have lab work done today you will be contacted with your lab results within the next 2 weeks.  If you have not heard from [place] then please contact us. The fastest way to get your results is to register for My Chart.   IF you received an x-ray today, you will receive an invoice from [facility]. Please contact [facility] at [phone] with questions or concerns regarding your invoice.   IF you received labwork today, you will receive an invoice from [facility]. Please contact [facility] at [phone] with questions or concerns regarding your invoice.   Our billing staff will not be able to assist you with questions regarding bills from these companies.  You will be contacted with the lab results as soon as they are available. The fastest way to get your results is to activate your My Chart account. Instructions are located on the last page of this paperwork. If you have not heard from [place] regarding the results in 2 weeks, please contact this office.     Weakness Weakness is a lack of strength. You may feel weak all over your body (generalized), or you may feel weak in one part of your body (focal). There are many potential causes of weakness. Sometimes, the cause of your weakness may not be known. Some causes of weakness can be serious, so it is important to see your doctor. Follow these instructions at home: Activity  Rest as needed.  Try to get enough sleep. Most adults need 7-8 hours of sleep each night. Talk to your doctor about how much sleep you need each night.  Do exercises, such as arm curls and leg raises, for 30 minutes at least 2 days a week or as told by your doctor.  Think about working with a physical therapist or trainer to help you get stronger. General instructions   Take over-the-counter and prescription medicines only as told by your doctor.  Eat a healthy, well-balanced diet. This includes: ? Proteins to build muscles, such as lean  meats and fish. ? Fresh fruits and vegetables. ? Carbohydrates to boost energy, such as whole grains.  Drink enough fluid to keep your pee (urine) pale yellow.  Keep all follow-up visits as told by your doctor. This is important. Contact a doctor if:  Your weakness does not get better or it gets worse.  Your weakness affects your ability to: ? Think clearly. ? Do your normal daily activities. Get help right away if you:  Have sudden weakness on one side of your face or body.  Have chest pain.  Have trouble breathing or shortness of breath.  Have problems with your vision.  Have trouble talking or swallowing.  Have trouble standing or walking.  Are light-headed.  Pass out (lose consciousness). Summary  Weakness is a lack of strength. You may feel weak all over your body or just in one part of your body.  There are many potential causes of weakness. Sometimes, the cause of your weakness may not be known.  Rest as needed, and try to get enough sleep. Most adults need 7-8 hours of sleep each night.  Eat a healthy, well-balanced diet. This information is not intended to replace advice given to you by your health care provider. Make sure you discuss any questions you have with your health care provider. Document Released: 11/16/2008 Document Revised: 07/10/2018 Document Reviewed: 07/10/2018 Elsevier Patient Education  2020 [employer].

## 2019-09-24 NOTE — Progress Notes (Signed)
Ryan Kramer 28 y.o.   Chief Complaint  Patient presents with  . Headache    x 1 weel agowith dizziness - PER PATIENT WAS TESTED FOR COVID on  09/09/2019 results NEGATIVE  . Fatigue    HISTORY OF PRESENT ILLNESS: This is a 28 y.o. male complaining of feeling sick for the past 3 weeks with dizziness, general weakness, diffuse headache and dry cough.  Non-smoker and no EtOH abuser.  Works 10 to 11-hour shifts at [employer], mostly standing up.  Suboptimal nutrition.  Sleeps 5 to 6 hours per night.  Snores.  Denies chronic medical problems.  No chronic medications. Recently was prescribed Bactrim DS, prednisone, and meclizine by his retired MD uncle for treatment of dizziness and possible ear/sinus infection.  No significant difference. No other associated symptoms or complaints.  HPI   Prior to Admission medications   Medication Sig Start Date End Date Taking? Authorizing Provider  meclizine (ANTIVERT) 25 MG tablet Take 1 tablet (25 mg total) by mouth 3 (three) times daily as needed for dizziness. 09/18/19  Yes Hall-Potvin, [place], PA-C    No Known Allergies  There are no active problems to display for this patient.   History reviewed. No pertinent past medical history.  Past Surgical History:  Procedure Laterality Date  . SHOULDER SURGERY      Social History   Socioeconomic History  . Marital status: Single    Spouse name: Not on file  . Number of children: Not on file  . Years of education: Not on file  . Highest education level: Not on file  Occupational History  . Not on file  Social Needs  . Financial resource strain: Not on file  . Food insecurity    Worry: Not on file    Inability: Not on file  . Transportation needs    Medical: Not on file    Non-medical: Not on file  Tobacco Use  . Smoking status: Current Every Day Smoker    Types: Cigars  . Smokeless tobacco: Never Used  . Tobacco comment: once a week  Substance and Sexual Activity  . Alcohol use:  Yes    Comment: 2-3 drinks a week  . Drug use: No  . Sexual activity: Not on file  Lifestyle  . Physical activity    Days per week: Not on file    Minutes per session: Not on file  . Stress: Not on file  Relationships  . Social Musicianconnections    Talks on phone: Not on file    Gets together: Not on file    Attends religious service: Not on file    Active member of club or organization: Not on file    Attends meetings of clubs or organizations: Not on file    Relationship status: Not on file  . Intimate partner violence    Fear of current or ex partner: Not on file    Emotionally abused: Not on file    Physically abused: Not on file    Forced sexual activity: Not on file  Other Topics Concern  . Not on file  Social History Narrative  . Not on file    Family History  Problem Relation Age of Onset  . Hypertension Mother   . Hypertension Father      Review of Systems  Constitutional: Positive for malaise/fatigue. Negative for chills and fever.  HENT: Negative.   Eyes: Negative.   Respiratory: Negative.  Negative for cough and shortness of breath.  Cardiovascular: Negative.  Negative for chest pain and palpitations.  Gastrointestinal: Negative for abdominal pain, blood in stool, constipation, diarrhea, nausea and vomiting.  Genitourinary: Negative.   Musculoskeletal: Negative.  Negative for myalgias and neck pain.  Skin: Negative.   Neurological: Positive for dizziness and headaches.  All other systems reviewed and are negative.  Vitals:   09/24/19 1451  BP: 119/79  Pulse: (!) 104  Resp: 16  Temp: 98.5 F (36.9 C)  SpO2: 96%     Physical Exam Vitals signs reviewed.  Constitutional:      Appearance: He is well-developed. He is obese.  HENT:     Head: Normocephalic.     Right Ear: Tympanic membrane, ear canal and external ear normal.     Left Ear: Tympanic membrane, ear canal and external ear normal.  Eyes:     Extraocular Movements: Extraocular movements  intact.     Conjunctiva/sclera: Conjunctivae normal.     Pupils: Pupils are equal, round, and reactive to light.  Neck:     Musculoskeletal: Normal range of motion.  Cardiovascular:     Rate and Rhythm: Normal rate and regular rhythm.     Pulses: Normal pulses.     Heart sounds: Normal heart sounds.  Pulmonary:     Effort: Pulmonary effort is normal.     Breath sounds: Normal breath sounds.  Neurological:     Mental Status: He is alert.  Psychiatric:        Mood and Affect: Mood normal.        Behavior: Behavior normal.     Dg Chest 2 View  Result Date: 09/24/2019 CLINICAL DATA:  Cough EXAM: CHEST - 2 VIEW COMPARISON:  09/04/2016 FINDINGS: The heart size and mediastinal contours are within normal limits. Both lungs are clear. The visualized skeletal structures are unremarkable. IMPRESSION: No active cardiopulmonary disease. Electronically Signed   By: Katherine Mantle M.D.   On: 09/24/2019 15:59    ASSESSMENT & PLAN: Harry was seen today for headache and fatigue.  Diagnoses and all orders for this visit:  General weakness -     DG Chest 2 View; Future -     CBC with Differential/Platelet -     Comprehensive metabolic panel -     TSH -     Hemoglobin A1c -     HIV Antibody (routine testing w rflx) -     Ambulatory referral to Neurology -     Lipid panel  Malaise and fatigue  Dizziness  Cough -     DG Chest 2 View; Future  Snoring -     Ambulatory referral to Neurology  Suspected sleep apnea -     Ambulatory referral to Neurology    Patient Instructions       If you have lab work done today you will be contacted with your lab results within the next 2 weeks.  If you have not heard from [place] then please contact us. The fastest way to get your results is to register for My Chart.   IF you received an x-ray today, you will receive an invoice from [facility]. Please contact [facility] at [phone] with questions or concerns regarding  your invoice.   IF you received labwork today, you will receive an invoice from [place]. Please contact [facility] at [phone] with questions or concerns regarding your invoice.   Our billing staff will not be able to assist you with questions regarding bills from these companies.  You will be  contacted with the lab results as soon as they are available. The fastest way to get your results is to activate your My Chart account. Instructions are located on the last page of this paperwork. If you have not heard from [place] regarding the results in 2 weeks, please contact this office.     Weakness Weakness is a lack of strength. You may feel weak all over your body (generalized), or you may feel weak in one part of your body (focal). There are many potential causes of weakness. Sometimes, the cause of your weakness may not be known. Some causes of weakness can be serious, so it is important to see your doctor. Follow these instructions at home: Activity  Rest as needed.  Try to get enough sleep. Most adults need 7-8 hours of sleep each night. Talk to your doctor about how much sleep you need each night.  Do exercises, such as arm curls and leg raises, for 30 minutes at least 2 days a week or as told by your doctor.  Think about working with a physical therapist or trainer to help you get stronger. General instructions   Take over-the-counter and prescription medicines only as told by your doctor.  Eat a healthy, well-balanced diet. This includes: ? Proteins to build muscles, such as lean meats and fish. ? Fresh fruits and vegetables. ? Carbohydrates to boost energy, such as whole grains.  Drink enough fluid to keep your pee (urine) pale yellow.  Keep all follow-up visits as told by your doctor. This is important. Contact a doctor if:  Your weakness does not get better or it gets worse.  Your weakness affects your ability to: ? Think clearly. ? Do your normal daily activities. Get  help right away if you:  Have sudden weakness on one side of your face or body.  Have chest pain.  Have trouble breathing or shortness of breath.  Have problems with your vision.  Have trouble talking or swallowing.  Have trouble standing or walking.  Are light-headed.  Pass out (lose consciousness). Summary  Weakness is a lack of strength. You may feel weak all over your body or just in one part of your body.  There are many potential causes of weakness. Sometimes, the cause of your weakness may not be known.  Rest as needed, and try to get enough sleep. Most adults need 7-8 hours of sleep each night.  Eat a healthy, well-balanced diet. This information is not intended to replace advice given to you by your health care provider. Make sure you discuss any questions you have with your health care provider. Document Released: 11/16/2008 Document Revised: 07/10/2018 Document Reviewed: 07/10/2018 Elsevier Patient Education  2020 [employer] Inc[employer]      Agustina Caroli, MD [facility]

## 2019-09-25 ENCOUNTER — Encounter: Payer: Self-pay | Admitting: Emergency Medicine

## 2019-09-25 LAB — COMPREHENSIVE METABOLIC PANEL
ALT: 79 IU/L — ABNORMAL HIGH (ref 0–44)
AST: 35 IU/L (ref 0–40)
Albumin/Globulin Ratio: 1.4 (ref 1.2–2.2)
Albumin: 4.4 g/dL (ref 4.1–5.2)
Alkaline Phosphatase: 117 IU/L (ref 39–117)
BUN/Creatinine Ratio: 14 (ref 9–20)
BUN: 15 mg/dL (ref 6–20)
Bilirubin Total: 0.3 mg/dL (ref 0.0–1.2)
CO2: 20 mmol/L (ref 20–29)
Calcium: 9.5 mg/dL (ref 8.7–10.2)
Chloride: 106 mmol/L (ref 96–106)
Creatinine, Ser: 1.09 mg/dL (ref 0.76–1.27)
GFR calc Af Amer: 105 mL/min/{1.73_m2} (ref >59)
GFR calc non Af Amer: 91 mL/min/{1.73_m2} (ref >59)
Globulin, Total: 3.2 g/dL (ref 1.5–4.5)
Glucose: 113 mg/dL — ABNORMAL HIGH (ref 65–99)
Potassium: 5.2 mmol/L (ref 3.5–5.2)
Sodium: 143 mmol/L (ref 134–144)
Total Protein: 7.6 g/dL (ref 6.0–8.5)

## 2019-09-25 LAB — CBC WITH DIFFERENTIAL/PLATELET
Basophils Absolute: 0.2 10*3/uL (ref 0.0–0.2)
Basos: 1 %
EOS (ABSOLUTE): 0.2 10*3/uL (ref 0.0–0.4)
Eos: 2 %
Hematocrit: 49 % (ref 37.5–51.0)
Hemoglobin: 16 g/dL (ref 13.0–17.7)
Immature Grans (Abs): 0.1 10*3/uL (ref 0.0–0.1)
Immature Granulocytes: 1 %
Lymphocytes Absolute: 5.8 10*3/uL — ABNORMAL HIGH (ref 0.7–3.1)
Lymphs: 52 %
MCH: 29 pg (ref 26.6–33.0)
MCHC: 32.7 g/dL (ref 31.5–35.7)
MCV: 89 fL (ref 79–97)
Monocytes Absolute: 1.4 10*3/uL — ABNORMAL HIGH (ref 0.1–0.9)
Monocytes: 12 %
Neutrophils Absolute: 3.6 10*3/uL (ref 1.4–7.0)
Neutrophils: 32 %
Platelets: 266 10*3/uL (ref 150–450)
RBC: 5.52 x10E6/uL (ref 4.14–5.80)
RDW: 13.3 % (ref 11.6–15.4)
WBC: 11.3 10*3/uL — ABNORMAL HIGH (ref 3.4–10.8)

## 2019-09-25 LAB — HIV ANTIBODY (ROUTINE TESTING W REFLEX): HIV Screen 4th Generation wRfx: NONREACTIVE

## 2019-09-25 LAB — LIPID PANEL
Chol/HDL Ratio: 4.5 ratio (ref 0.0–5.0)
Cholesterol, Total: 150 mg/dL (ref 100–199)
HDL: 33 mg/dL — ABNORMAL LOW (ref >39)
LDL Chol Calc (NIH): 90 mg/dL (ref 0–99)
Triglycerides: 153 mg/dL — ABNORMAL HIGH (ref 0–149)
VLDL Cholesterol Cal: 27 mg/dL (ref 5–40)

## 2019-09-25 LAB — TSH: TSH: 0.855 u[IU]/mL (ref 0.450–4.500)

## 2019-09-25 LAB — HEMOGLOBIN A1C
Est. average glucose Bld gHb Est-mCnc: 120 mg/dL
Hgb A1c MFr Bld: 5.8 % — ABNORMAL HIGH (ref 4.8–5.6)

## 2019-09-26 ENCOUNTER — Other Ambulatory Visit: Payer: Self-pay

## 2019-09-26 DIAGNOSIS — Z20822 Contact with and (suspected) exposure to covid-19: Secondary | ICD-10-CM

## 2019-09-27 LAB — NOVEL CORONAVIRUS, NAA: SARS-CoV-2, NAA: NOT DETECTED

## 2019-10-06 ENCOUNTER — Ambulatory Visit: Payer: Self-pay | Admitting: Neurology

## 2019-10-06 ENCOUNTER — Encounter: Payer: Self-pay | Admitting: Neurology

## 2019-10-06 ENCOUNTER — Other Ambulatory Visit: Payer: Self-pay

## 2019-10-06 VITALS — BP 152/94 | HR 90 | Temp 98.6°F | Ht 72.0 in | Wt 335.0 lb

## 2019-10-06 DIAGNOSIS — R0683 Snoring: Secondary | ICD-10-CM

## 2019-10-06 DIAGNOSIS — R519 Headache, unspecified: Secondary | ICD-10-CM

## 2019-10-06 DIAGNOSIS — R351 Nocturia: Secondary | ICD-10-CM

## 2019-10-06 DIAGNOSIS — Z6841 Body Mass Index (BMI) 40.0 and over, adult: Secondary | ICD-10-CM

## 2019-10-06 DIAGNOSIS — G4719 Other hypersomnia: Secondary | ICD-10-CM

## 2019-10-06 NOTE — Progress Notes (Signed)
Subjective:    Patient ID: Ryan Kramer is a 28 y.o. male.  HPI     Huston Foley, MD, PhD [facility] [address], [address] [address] [place], [place] [address]  Dear Dr. Alvy Bimler, I saw your patient, Ryan Kramer, upon your kind request in my sleep clinic today for initial consultation of his sleep disorder, in particular, concern for underlying obstructive sleep apnea.  The patient is unaccompanied today.  As you know, [name] is a 28 year old right-handed gentleman with an underlying benign medical history other than morbid obesity with a BMI of over 45, who reports snoring and excessive daytime somnolence, intermittent headaches.  I reviewed your office note from 09/24/2019.  He was recently tested for COVID-19 and tested negative.  His blood pressure is elevated today but was noted to be normal at his visit with you.  He reports difficulty initiating and maintaining sleep for the past 2 years.  His snoring has been noted by his uncle and his mother.  He lives with his mother, paternal uncle has sleep apnea and uses a CPAP machine.  Patient reports that he has been told he has stops in his breathing.  He also endorses restless leg symptoms and kicking in his sleep.  He has a TV in the bedroom and does not always leave it on at night but sometimes it stays on.  He has no pets in the household.  He estimates that he gets about 4 to 5 hours of sleep on any given night.  He has tried melatonin which did not help.  He does not currently drink any caffeine.  He has reduced his alcohol intake and reports that he was drinking quite heavily.  He is now drinking approximately half a bottle of liquor once or twice a week.  He used to drink daily.  He smokes occasional cigars, no cigarettes.  He is trying to lose weight and has lost some weight in the past 2 months but in the past year, he had gained about 80 pounds.  Bedtime is generally between 10 and 10:30 PM and rise time is  between 6 and 6:30 AM.  He has had rare morning headaches, he has nocturia about 2-4 times per average night.  Epworth sleepiness score is 9 out of 24, fatigue severity score is 43 out of 63.  His Past Medical History Is Significant For: No past medical history on file.  His Past Surgical History Is Significant For: Past Surgical History:  Procedure Laterality Date  . SHOULDER SURGERY      His Family History Is Significant For: Family History  Problem Relation Age of Onset  . Hypertension Mother   . Hypertension Father     His Social History Is Significant For: Social History   Socioeconomic History  . Marital status: Single    Spouse name: Not on file  . Number of children: Not on file  . Years of education: Not on file  . Highest education level: Not on file  Occupational History  . Not on file  Social Needs  . Financial resource strain: Not on file  . Food insecurity    Worry: Not on file    Inability: Not on file  . Transportation needs    Medical: Not on file    Non-medical: Not on file  Tobacco Use  . Smoking status: Current Every Day Smoker    Types: Cigars  . Smokeless tobacco: Never Used  . Tobacco comment:  once a week  Substance and Sexual Activity  . Alcohol use: Yes    Comment: 2-3 drinks a week  . Drug use: No  . Sexual activity: Not on file  Lifestyle  . Physical activity    Days per week: Not on file    Minutes per session: Not on file  . Stress: Not on file  Relationships  . Social Musicianconnections    Talks on phone: Not on file    Gets together: Not on file    Attends religious service: Not on file    Active member of club or organization: Not on file    Attends meetings of clubs or organizations: Not on file    Relationship status: Not on file  Other Topics Concern  . Not on file  Social History Narrative  . Not on file    His Allergies Are:  No Known Allergies:   His Current Medications Are:  Outpatient Encounter Medications as of  10/06/2019  Medication Sig  . meclizine (ANTIVERT) 25 MG tablet Take 1 tablet (25 mg total) by mouth 3 (three) times daily as needed for dizziness.  Marland Kitchen. VITAMIN D PO Take by mouth.   No facility-administered encounter medications on file as of 10/06/2019.   :  Review of Systems:  Out of a complete 14 point review of systems, all are reviewed and negative with the exception of these symptoms as listed below: Review of Systems  Neurological:       Pt presents today to discuss her sleep. Pt has never had a sleep study but does endorse snoring.  Epworth Sleepiness Scale 0= would never doze 1= slight chance of dozing 2= moderate chance of dozing 3= high chance of dozing  Sitting and reading: 1 Watching TV: 3 Sitting inactive in a public place (ex. Theater or meeting): 1 As a passenger in a car for an hour without a break: 0 Lying down to rest in the afternoon: 2 Sitting and talking to someone: 0 Sitting quietly after lunch (no alcohol): 2 In a car, while stopped in traffic: 0 Total: 9     Objective:  Neurological Exam  Physical Exam Physical Examination:   Vitals:   10/06/19 0857  BP: (!) 152/94  Pulse: 90  Temp: 98.6 F (37 C)    General Examination: The patient is a very pleasant 28 y.o. male in no acute distress. He appears well-developed and well-nourished and very well groomed.   HEENT: Normocephalic, atraumatic, pupils are equal, round and reactive to light. Extraocular tracking is good without limitation to gaze excursion or nystagmus noted. Normal smooth pursuit is noted. Hearing is grossly intact. Face is symmetric with normal facial animation and normal facial sensation. Speech is clear with no dysarthria noted. There is no hypophonia. There is no lip, neck/head, jaw or voice tremor. Neck with full range of motion. There are no carotid bruits on auscultation. Oropharynx exam reveals: mild mouth dryness, adequate dental hygiene and moderate airway crowding, due to  Small airway entry, tonsillar size of about 2+, thicker tongue, Mallampati is class III.  Tongue protrudes centrally in palate elevates symmetrically, neck circumference is 17-1/4 inches.  He has a mild overbite.  Chest: Clear to auscultation without wheezing, rhonchi or crackles noted.  Heart: S1+S2+0, regular and normal without murmurs, rubs or gallops noted.   Abdomen: Soft, non-tender and non-distended with normal bowel sounds appreciated on auscultation.  Extremities: There is no pitting edema in the distal lower extremities bilaterally.   Skin:  Warm and dry without trophic changes noted.  Musculoskeletal: exam reveals no obvious joint deformities, tenderness or joint swelling or erythema.   Neurologically:  Mental status: The patient is awake, alert and oriented in all 4 spheres. His immediate and remote memory, attention, language skills and fund of knowledge are appropriate. There is no evidence of aphasia, agnosia, apraxia or anomia. Speech is clear with normal prosody and enunciation. Thought process is linear. Mood is normal and affect is normal.  Cranial nerves II - XII are as described above under HEENT exam. In addition: shoulder shrug is normal with equal shoulder height noted. Motor exam: Normal bulk, strength and tone is noted. There is no drift, tremor or rebound. Romberg is negative. Reflexes are 1+ in the UEs and trace in the LEs. Fine motor skills and coordination: intact with normal finger taps, normal hand movements, normal rapid alternating patting, normal foot taps and normal foot agility.  Cerebellar testing: No dysmetria or intention tremor on finger to nose testing. There is no truncal or gait ataxia.  Sensory exam: intact to light touch.  Gait, station and balance: He stands easily. No veering to one side is noted. No leaning to one side is noted. Posture is age-appropriate and stance is narrow based. Gait shows normal stride length and normal pace. No problems  turning are noted. Tandem walk is unremarkable.   Assessment and Plan:    In summary, Shannen A Getman is a very pleasant 28 y.o.-year old male  with an underlying benign medical history other than morbid obesity with a BMI of over 45, whose history and physical exam are concerning for obstructive sleep apnea (OSA). I had a long chat with the patient about my findings and the diagnosis of OSA, its prognosis and treatment options. We talked about medical treatments, surgical interventions and non-pharmacological approaches. I explained in particular the risks and ramifications of untreated moderate to severe OSA, especially with respect to developing cardiovascular disease down the Road, including congestive heart failure, difficult to treat hypertension, cardiac arrhythmias, or stroke. Even type 2 diabetes has, in part, been linked to untreated OSA. Symptoms of untreated OSA include daytime sleepiness, memory problems, mood irritability and mood disorder such as depression and anxiety, lack of energy, as well as recurrent headaches, especially morning headaches. We talked about trying to maintain a healthy lifestyle in general, as well as the importance of weight control. We also talked about the importance of good sleep hygiene. I recommended the following at this time: sleep study.   I explained the sleep test procedure to the patient and also outlined possible surgical and non-surgical treatment options of OSA, including the use of a custom-made dental device (which would require a referral to a specialist dentist or oral surgeon), upper airway surgical options, such as traditional UPPP or a novel less invasive surgical option in the form of Inspire hypoglossal nerve stimulation (which would involve a referral to an ENT surgeon). I also explained the CPAP treatment option to the patient, who indicated that he would be willing to try CPAP if the need arises. I explained the importance of being compliant  with PAP treatment, not only for insurance purposes but primarily to improve His symptoms, and for the patient's long term health benefit, including to reduce His cardiovascular risks. I answered all his questions today and the patient was in agreement. I plan to see him back after the sleep study is completed and encouraged him to call with any interim questions, concerns, problems  or updates.   Thank you very much for allowing me to participate in the care of this nice patient. If I can be of any further assistance to you please do not hesitate to call me at [phone].  Sincerely,   Star Age, MD, PhD

## 2019-10-06 NOTE — Patient Instructions (Signed)

## 2019-11-01 ENCOUNTER — Other Ambulatory Visit: Payer: Self-pay

## 2019-11-01 ENCOUNTER — Ambulatory Visit (INDEPENDENT_AMBULATORY_CARE_PROVIDER_SITE_OTHER): Payer: Self-pay | Admitting: Neurology

## 2019-11-01 DIAGNOSIS — Z6841 Body Mass Index (BMI) 40.0 and over, adult: Secondary | ICD-10-CM

## 2019-11-01 DIAGNOSIS — G4719 Other hypersomnia: Secondary | ICD-10-CM

## 2019-11-01 DIAGNOSIS — R519 Headache, unspecified: Secondary | ICD-10-CM

## 2019-11-01 DIAGNOSIS — G4720 Circadian rhythm sleep disorder, unspecified type: Secondary | ICD-10-CM

## 2019-11-01 DIAGNOSIS — R0683 Snoring: Secondary | ICD-10-CM

## 2019-11-01 DIAGNOSIS — G4733 Obstructive sleep apnea (adult) (pediatric): Secondary | ICD-10-CM

## 2019-11-01 DIAGNOSIS — R351 Nocturia: Secondary | ICD-10-CM

## 2019-11-11 ENCOUNTER — Telehealth: Payer: Self-pay | Admitting: Neurology

## 2019-11-11 NOTE — Telephone Encounter (Signed)
I called patient and LVM to advise that we have received paperwork from [facility], advised patient that the form will not be filled out and submitted until form fee of $50 is paid. Requested patient call back to pay.

## 2019-11-18 ENCOUNTER — Telehealth: Payer: Self-pay | Admitting: Neurology

## 2019-11-18 NOTE — Telephone Encounter (Signed)
I called pt. I advised pt that Dr. Rexene Alberts reviewed their sleep study results and found that pt has sleep apnea. Dr. Rexene Alberts recommends that pt starts auto CPAP. I reviewed PAP compliance expectations with the pt. Pt is agreeable to starting a CPAP. I advised pt that an order will be sent to a DME, [facility], and [facility] will call the pt within about one week after they file with the pt's insurance. [facility] will show the pt how to use the machine, fit for masks, and troubleshoot the CPAP if needed. A follow up appt was made for insurance purposes with Debbora Presto,  on Jan 28,2021 at 9:30 am. Pt verbalized understanding to arrive 15 minutes early and bring their CPAP. A letter with all of this information in it will be mailed to the pt as a at  reminder. I verified with the pt that the address we have on file is correct. Pt verbalized understanding of results. Pt had no questions at this time but was encouraged to call back if questions arise. I have sent the order to [facility] and have received confirmation that they have received the order.

## 2019-11-18 NOTE — Progress Notes (Signed)
psg report and PAP order

## 2019-11-18 NOTE — Progress Notes (Signed)
Patient referred by Dr. Mitchel Honour, seen by me on 10/06/19, diagnostic PSG on 11/01/19.    Please call and notify the patient that the recent sleep study did confirm the diagnosis of obstructive sleep apnea. OSA is overall mild, but more moderate in supine sleep and severe in dream/REM sleep; therefore, worth treating to see if he feels better after treatment, such as his headaches and daytime energy. To that end, I recommend treatment for this in the form of autoPAP, which means, that we don't have to bring him back for a second sleep study with CPAP, but will let him try an autoPAP machine at home, through a DME company (of his choice, or as per insurance requirement). The DME representative will educate him on how to use the machine, how to put the mask on, etc. I have placed an order in the chart. Please send referral, talk to patient, send report to referring MD. We will need a FU in sleep clinic for 10 weeks post-PAP set up, please arrange that with me or one of our NPs. Thanks,   Star Age, MD, PhD [facility])

## 2019-11-18 NOTE — Telephone Encounter (Signed)
-----   Message from Star Age, MD sent at 11/18/2019  8:07 AM EST ----- Patient referred by Dr. Mitchel Honour, seen by me on 10/06/19, diagnostic PSG on 11/01/19.    Please call and notify the patient that the recent sleep study did confirm the diagnosis of obstructive sleep apnea. OSA is overall mild, but more moderate in supine sleep and severe in dream/REM sleep; therefore, worth treating to see if he feels better after treatment, such as his headaches and daytime energy. To that end, I recommend treatment for this in the form of autoPAP, which means, that we don't have to bring him back for a second sleep study with CPAP, but will let him try an autoPAP machine at home, through a DME company (of his choice, or as per insurance requirement). The DME representative will educate him on how to use the machine, how to put the mask on, etc. I have placed an order in the chart. Please send referral, talk to patient, send report to referring MD. We will need a FU in sleep clinic for 10 weeks post-PAP set up, please arrange that with me or one of our NPs. Thanks,   Star Age, MD, PhD [facility])

## 2019-11-18 NOTE — Procedures (Signed)
PATIENT'S NAME:  Ryan Kramer, Ryan Kramer DOB:      [birth year]      MRN:    [id number]     DATE OF RECORDING: 11/01/2019 REFERRING M.D.:  Edwina Barth, MD  Study Performed:   Baseline Polysomnogram HISTORY:  28 year old right-handed gentleman with an underlying benign medical history other than morbid obesity with a BMI of over 45, who reports snoring and excessive daytime somnolence, intermittent headaches. The patient endorsed the Epworth Sleepiness Scale at 9 points. The patient's weight 335 pounds with a height of 72 (inches), resulting in a BMI of 45.4 kg/m2. The patient's neck circumference measured 17.2 inches.  CURRENT MEDICATIONS: Meclizine, Vitamin D   PROCEDURE:  This is a multichannel digital polysomnogram utilizing the Somnostar 11.2 system.  Electrodes and sensors were applied and monitored per AASM Specifications.   EEG, EOG, Chin and Limb EMG, were sampled at 200 Hz.  ECG, Snore and Nasal Pressure, Thermal Airflow, Respiratory Effort, CPAP Flow and Pressure, Oximetry was sampled at 50 Hz. Digital video and audio were recorded.      BASELINE STUDY: Lights Out was at 22:01 and Lights On at 04:57.  Total recording time (TRT) was 416 minutes, with a total sleep time (TST) of 318 minutes.   The patient's sleep latency was 147 minutes, which is delayed.  REM latency was 170 minutes, which is delayed. The sleep efficiency was 76.4 %.     SLEEP ARCHITECTURE: WASO (Wake after sleep onset) was 46.5 minutes with moderate, then mild sleep fragmentation noted. There were 23 minutes in Stage N1, 178.5 minutes Stage N2, 56 minutes Stage N3 and 60.5 minutes in Stage REM.  The percentage of Stage N1 was 7.2%, Stage N2 was 56.1%, Stage N3 was 17.6% and Stage R (REM sleep) was 19.%; all fairly normal.  RESPIRATORY ANALYSIS:  There were a total of 73 respiratory events:  10 obstructive apneas, 0 central apneas and 2 mixed apneas with a total of 12 apneas and an apnea index (AI) of 2.3 /hour. There were 61  hypopneas with a hypopnea index of 11.5 /hour. The patient also had 0 respiratory event related arousals (RERAs).      The total APNEA/HYPOPNEA INDEX (AHI) was 13.8 /hour and the total RESPIRATORY DISTURBANCE INDEX was 13.8 /hour.  37 events occurred in REM sleep and 71 events in NREM. The REM AHI was 36.7 /hour, versus a non-REM AHI of 8.4. The patient spent 231.5 minutes of total sleep time in the supine position and 87 minutes in non-supine.. The supine AHI was 17.9 /hour versus a non-supine AHI of 2.8/hour.  OXYGEN SATURATION & C02:  The Wake baseline 02 saturation was 92%, with the lowest being 83%. Time spent below 89% saturation equaled 4 minutes.  AROUSALS:  The arousals were noted as: 58 were spontaneous, 0 were associated with PLMs, 22 were associated with respiratory events.  PLMs: The patient had a total of 0 Periodic Limb Movements.  The Periodic Limb Movement (PLM) index was 0 /hour and the PLM Arousal index was 0/hour.  Audio and video analysis did not show any abnormal or unusual movements, complex behaviors, phonations or vocalizations. He took no bathroom breaks. Mild to moderate snoring was noted. The EKG in normal sinus rhythm (NSR).  IMPRESSION:  1. Obstructive Sleep Apnea (OSA) 2. Dysfunctions associated with sleep stages or arousals from sleep   RECOMMENDATIONS:  1. This study demonstrates overall mild obstructive sleep apnea, moderate during supine sleep, severe in REM sleep with a total AHI of  13.8/hour, REM AHI of 36.7/hour, supine AHI of 17.9/hour, and O2 nadir of 83%. Given the patient's medical history and sleep related complaints, treatment with positive airway pressure is recommended; this can be achieved in the form of autoPAP. Alternatively, a full-night CPAP titration study would allow optimization of therapy, if needed, down the road. Weight loss is highly recommended. Other treatment options may include avoidance of supine sleep position along with weight loss,  upper airway or jaw surgery in selected patients or the use of an oral appliance in certain patients. ENT evaluation and/or consultation with a maxillofacial surgeon or dentist may be feasible in some instances.    2. Please note that untreated obstructive sleep apnea may carry additional perioperative morbidity. Patients with significant obstructive sleep apnea should receive perioperative PAP therapy and the surgeons and particularly the anesthesiologist should be informed of the diagnosis and the severity of the sleep disordered breathing. 3. The patient should be cautioned not to drive, work at heights, or operate dangerous or heavy equipment when tired or sleepy. Review and reiteration of good sleep hygiene measures should be pursued with any patient. 4. The patient will be seen in follow-up by Dr. Rexene Alberts at [facility] for discussion of the test results and further management strategies. The referring provider will be notified of the test results.  I certify that I have reviewed the entire raw data recording prior to the issuance of this report in accordance with the Standards of Accreditation of the American Academy of Sleep Medicine (AASM)   Star Age, MD, PhD Diplomat, American Board of Neurology and Sleep Medicine (Neurology and Sleep Medicine)

## 2019-11-18 NOTE — Addendum Note (Signed)
Addended by: Star Age on: 11/18/2019 08:07 AM   Modules accepted: Orders

## 2020-01-15 ENCOUNTER — Ambulatory Visit: Payer: Self-pay | Admitting: Family Medicine

## 2020-01-15 ENCOUNTER — Telehealth: Payer: Self-pay | Admitting: Neurology

## 2020-01-15 NOTE — Telephone Encounter (Signed)
Pt has called to report due to work schedule conflict he will not make today's appointment

## 2020-02-21 ENCOUNTER — Emergency Department (HOSPITAL_COMMUNITY): Payer: Self-pay

## 2020-02-21 ENCOUNTER — Other Ambulatory Visit: Payer: Self-pay

## 2020-02-21 ENCOUNTER — Encounter (HOSPITAL_COMMUNITY): Payer: Self-pay | Admitting: *Deleted

## 2020-02-21 ENCOUNTER — Emergency Department (HOSPITAL_COMMUNITY)
Admission: EM | Admit: 2020-02-21 | Discharge: 2020-02-22 | Disposition: A | Discharge status: Home / Self Care | Payer: Self-pay | Attending: Emergency Medicine | Admitting: Emergency Medicine

## 2020-02-21 DIAGNOSIS — Z20822 Contact with and (suspected) exposure to covid-19: Secondary | ICD-10-CM | POA: Insufficient documentation

## 2020-02-21 DIAGNOSIS — R471 Dysarthria and anarthria: Secondary | ICD-10-CM | POA: Insufficient documentation

## 2020-02-21 DIAGNOSIS — F1310 Sedative, hypnotic or anxiolytic abuse, uncomplicated: Secondary | ICD-10-CM | POA: Insufficient documentation

## 2020-02-21 DIAGNOSIS — R519 Headache, unspecified: Secondary | ICD-10-CM

## 2020-02-21 DIAGNOSIS — R112 Nausea with vomiting, unspecified: Secondary | ICD-10-CM | POA: Insufficient documentation

## 2020-02-21 DIAGNOSIS — R479 Unspecified speech disturbances: Secondary | ICD-10-CM | POA: Insufficient documentation

## 2020-02-21 DIAGNOSIS — R42 Dizziness and giddiness: Secondary | ICD-10-CM | POA: Insufficient documentation

## 2020-02-21 DIAGNOSIS — F17290 Nicotine dependence, other tobacco product, uncomplicated: Secondary | ICD-10-CM | POA: Insufficient documentation

## 2020-02-21 DIAGNOSIS — A879 Viral meningitis, unspecified: Secondary | ICD-10-CM | POA: Insufficient documentation

## 2020-02-21 DIAGNOSIS — R202 Paresthesia of skin: Secondary | ICD-10-CM | POA: Insufficient documentation

## 2020-02-21 DIAGNOSIS — H53149 Visual discomfort, unspecified: Secondary | ICD-10-CM | POA: Insufficient documentation

## 2020-02-21 LAB — URINALYSIS, ROUTINE W REFLEX MICROSCOPIC
Bacteria, UA: NONE SEEN
Bilirubin Urine: NEGATIVE
Glucose, UA: NEGATIVE mg/dL
Hgb urine dipstick: NEGATIVE
Ketones, ur: 5 mg/dL — AB
Leukocytes,Ua: NEGATIVE
Nitrite: NEGATIVE
Protein, ur: 30 mg/dL — AB
Specific Gravity, Urine: 1.029 (ref 1.005–1.030)
pH: 5 (ref 5.0–8.0)

## 2020-02-21 LAB — CBC
HCT: 49.7 % (ref 39.0–52.0)
Hemoglobin: 16.3 g/dL (ref 13.0–17.0)
MCH: 30.8 pg (ref 26.0–34.0)
MCHC: 32.8 g/dL (ref 30.0–36.0)
MCV: 94 fL (ref 80.0–100.0)
Platelets: 227 10*3/uL (ref 150–400)
RBC: 5.29 MIL/uL (ref 4.22–5.81)
RDW: 13.1 % (ref 11.5–15.5)
WBC: 13.9 10*3/uL — ABNORMAL HIGH (ref 4.0–10.5)
nRBC: 0 % (ref 0.0–0.2)

## 2020-02-21 LAB — BASIC METABOLIC PANEL
Anion gap: 13 (ref 5–15)
BUN: 15 mg/dL (ref 6–20)
CO2: 23 mmol/L (ref 22–32)
Calcium: 9.3 mg/dL (ref 8.9–10.3)
Chloride: 101 mmol/L (ref 98–111)
Creatinine, Ser: 1.45 mg/dL — ABNORMAL HIGH (ref 0.61–1.24)
GFR calc Af Amer: >60 mL/min (ref >60)
GFR calc non Af Amer: >60 mL/min (ref >60)
Glucose, Bld: 155 mg/dL — ABNORMAL HIGH (ref 70–99)
Potassium: 4.1 mmol/L (ref 3.5–5.1)
Sodium: 137 mmol/L (ref 135–145)

## 2020-02-21 LAB — CBG MONITORING, ED: Glucose-Capillary: 157 mg/dL — ABNORMAL HIGH (ref 70–99)

## 2020-02-21 MED ORDER — SODIUM CHLORIDE 0.9% FLUSH: 3.0000 mL | Freq: Once | INTRAVENOUS | Status: DC

## 2020-02-21 NOTE — ED Triage Notes (Signed)
Pt says that for about three days, he has had headaches, associated with episode of n/v,  seem to be worse in the morning times, improves mid day. Has had some dizziness.  Reports fevers last week, chills this week. Symmetrical smile, equal grips, ambulatory with steady gait.

## 2020-02-21 NOTE — ED Triage Notes (Signed)
Spoke with pt mother on the phone, who says that the patient has had migraines for about two weeks, reporting today (about 45 minutes PTA) had some numbness, slurring of his speech and limping when he walked, that seem to improve en route to the ED.

## 2020-02-22 ENCOUNTER — Emergency Department (HOSPITAL_COMMUNITY): Payer: Self-pay

## 2020-02-22 LAB — COMPREHENSIVE METABOLIC PANEL
ALT: 24 U/L (ref 0–44)
AST: 18 U/L (ref 15–41)
Albumin: 3.9 g/dL (ref 3.5–5.0)
Alkaline Phosphatase: 75 U/L (ref 38–126)
Anion gap: 10 (ref 5–15)
BUN: 15 mg/dL (ref 6–20)
CO2: 22 mmol/L (ref 22–32)
Calcium: 9.3 mg/dL (ref 8.9–10.3)
Chloride: 107 mmol/L (ref 98–111)
Creatinine, Ser: 1.3 mg/dL — ABNORMAL HIGH (ref 0.61–1.24)
GFR calc Af Amer: >60 mL/min (ref >60)
GFR calc non Af Amer: >60 mL/min (ref >60)
Glucose, Bld: 126 mg/dL — ABNORMAL HIGH (ref 70–99)
Potassium: 4.9 mmol/L (ref 3.5–5.1)
Sodium: 139 mmol/L (ref 135–145)
Total Bilirubin: 0.7 mg/dL (ref 0.3–1.2)
Total Protein: 7.2 g/dL (ref 6.5–8.1)

## 2020-02-22 LAB — RAPID URINE DRUG SCREEN, HOSP PERFORMED
Amphetamines: NOT DETECTED
Barbiturates: NOT DETECTED
Benzodiazepines: POSITIVE — AB
Cocaine: NOT DETECTED
Opiates: NOT DETECTED
Tetrahydrocannabinol: NOT DETECTED

## 2020-02-22 LAB — CSF CELL COUNT WITH DIFFERENTIAL
Eosinophils, CSF: 0 % (ref 0–1)
Eosinophils, CSF: 0 % (ref 0–1)
Lymphs, CSF: 93 % — ABNORMAL HIGH (ref 40–80)
Lymphs, CSF: 96 % — ABNORMAL HIGH (ref 40–80)
Monocyte-Macrophage-Spinal Fluid: 4 % — ABNORMAL LOW (ref 15–45)
Monocyte-Macrophage-Spinal Fluid: 7 % — ABNORMAL LOW (ref 15–45)
RBC Count, CSF: 1 /mm3 — ABNORMAL HIGH
RBC Count, CSF: 80 /mm3 — ABNORMAL HIGH
Segmented Neutrophils-CSF: 0 % (ref 0–6)
Segmented Neutrophils-CSF: 0 % (ref 0–6)
Tube #: 1
Tube #: 4
WBC, CSF: 130 /mm3 (ref 0–5)
WBC, CSF: 131 /mm3 (ref 0–5)

## 2020-02-22 LAB — APTT: aPTT: 26 seconds (ref 24–36)

## 2020-02-22 LAB — SARS CORONAVIRUS 2 (TAT 6-24 HRS): SARS Coronavirus 2: NEGATIVE

## 2020-02-22 LAB — HIV ANTIBODY (ROUTINE TESTING W REFLEX): HIV Screen 4th Generation wRfx: NONREACTIVE

## 2020-02-22 LAB — PROTIME-INR
INR: 1.1 (ref 0.8–1.2)
Prothrombin Time: 14 seconds (ref 11.4–15.2)

## 2020-02-22 LAB — PROTEIN, CSF: Total  Protein, CSF: 162 mg/dL — ABNORMAL HIGH (ref 15–45)

## 2020-02-22 LAB — GLUCOSE, CSF: Glucose, CSF: 72 mg/dL — ABNORMAL HIGH (ref 40–70)

## 2020-02-22 MED ORDER — METOCLOPRAMIDE HCL 5 MG/ML IJ SOLN
10.0000 mg | Freq: Once | INTRAMUSCULAR | Status: AC
  Administered 2020-02-22: 02:00:00 10 mg via INTRAVENOUS
  Filled 2020-02-22: qty 2

## 2020-02-22 MED ORDER — GADOBUTROL 1 MMOL/ML IV SOLN
10.0000 mL | Freq: Once | INTRAVENOUS | Status: AC | PRN
  Administered 2020-02-22: 10 mL via INTRAVENOUS

## 2020-02-22 MED ORDER — DEXAMETHASONE SODIUM PHOSPHATE 10 MG/ML IJ SOLN
10.0000 mg | Freq: Once | INTRAMUSCULAR | Status: AC
  Administered 2020-02-22: 02:00:00 10 mg via INTRAVENOUS
  Filled 2020-02-22: qty 1

## 2020-02-22 MED ORDER — LORAZEPAM 2 MG/ML IJ SOLN
2.0000 mg | Freq: Once | INTRAMUSCULAR | Status: AC
  Administered 2020-02-22: 2 mg via INTRAVENOUS
  Filled 2020-02-22: qty 1

## 2020-02-22 MED ORDER — LIDOCAINE-EPINEPHRINE (PF) 2 %-1:200000 IJ SOLN
10.0000 mL | Freq: Once | INTRAMUSCULAR | Status: AC
  Administered 2020-02-22: 10 mL
  Filled 2020-02-22: qty 20

## 2020-02-22 MED ORDER — DIPHENHYDRAMINE HCL 50 MG/ML IJ SOLN
25.0000 mg | Freq: Once | INTRAMUSCULAR | Status: AC
  Administered 2020-02-22: 25 mg via INTRAVENOUS
  Filled 2020-02-22: qty 1

## 2020-02-22 NOTE — ED Notes (Signed)
Pt transported to MRI via stretcher at this time.  °

## 2020-02-22 NOTE — ED Notes (Signed)
Please call mother Army Melia  @ [phone]-- for status update--Leslie

## 2020-02-22 NOTE — ED Provider Notes (Signed)
[facility] EMERGENCY DEPARTMENT Provider Note   CSN: [id number] Arrival date & time: 02/21/20  1930     History Chief Complaint  Patient presents with  . Headache    Ryan Kramer is a 29 y.o. male.  Patient to ED for evaluation of headache that started 2 weeks ago. It is a daily headache. He describes a generalized distribution of pain with a throbbing sensation "like a pulse in my ears". If he bends his neck forward or backward, the headache is worse. There is photophobia and nausea with infrequent vomiting. No history of headaches. He does not smoke, or use alcohol or drugs. No fever, SOB. Tonight he reports that symptoms were associated with dizziness/lightheadedness "like I wanted to pass out". He developed numbness in the right UE and LE, that progressed to include the left UE and LE. He experienced slurred speech/difficulty speaking because he felt his tongue was numb as well. It became hard to walk because "I couldn't feel my legs." Symptoms of numbness and slurred speech lasted about 20 minutes and resolved. Currently all symptoms have resolved except for his headache.   The history is provided by the patient. No language interpreter was used.       History reviewed. No pertinent past medical history.  There are no problems to display for this patient.   Past Surgical History:  Procedure Laterality Date  . SHOULDER SURGERY         Family History  Problem Relation Age of Onset  . Hypertension Mother   . Hypertension Father     Social History   Tobacco Use  . Smoking status: Current Every Day Smoker    Types: Cigars  . Smokeless tobacco: Never Used  . Tobacco comment: once a week  Substance Use Topics  . Alcohol use: Yes    Comment: 2-3 drinks a week  . Drug use: No    Home Medications Prior to Admission medications   Medication Sig Start Date End Date Taking? Authorizing Provider  ibuprofen (ADVIL) 200 MG tablet Take 200 mg by mouth  every 6 (six) hours as needed for mild pain.   Yes [provider]  pseudoephedrine-acetaminophen (TYLENOL SINUS) 30-500 MG TABS tablet Take 1 tablet by mouth every 4 (four) hours as needed (for sinus pressure).   Yes [provider]  meclizine (ANTIVERT) 25 MG tablet Take 1 tablet (25 mg total) by mouth 3 (three) times daily as needed for dizziness. Patient not taking: Reported on 02/22/2020 09/18/19   Hall-Potvin, [place], PA-C    Allergies    Patient has no known allergies.  Review of Systems   Review of Systems  Constitutional: Negative for chills and fever.  HENT: Negative.   Eyes: Positive for photophobia.  Respiratory: Negative.  Negative for shortness of breath.   Cardiovascular: Negative.  Negative for chest pain.  Gastrointestinal: Positive for nausea and vomiting. Negative for abdominal pain.  Musculoskeletal: Negative.   Skin: Negative.   Neurological: Positive for dizziness, speech difficulty, light-headedness, numbness and headaches. Negative for seizures, syncope and facial asymmetry.    Physical Exam Updated Vital Signs BP (!) 161/89 (BP Location: Right Arm)   Pulse (!) 58   Temp 98.4 F (36.9 C) (Oral)   Resp 15   SpO2 99%   Physical Exam Vitals and nursing note reviewed.  Constitutional:      Appearance: He is well-developed.  HENT:     Head: Normocephalic.  Eyes:     General: No  visual field deficit.    Pupils: Pupils are equal, round, and reactive to light.  Cardiovascular:     Rate and Rhythm: Normal rate and regular rhythm.  Pulmonary:     Effort: Pulmonary effort is normal.     Breath sounds: Normal breath sounds.  Abdominal:     General: Bowel sounds are normal.     Palpations: Abdomen is soft.     Tenderness: There is no abdominal tenderness. There is no guarding or rebound.  Musculoskeletal:        General: Normal range of motion.     Cervical back: Normal range of motion and neck supple.  Skin:    General: Skin is warm  and dry.     Findings: No rash.  Neurological:     Mental Status: He is alert and oriented to person, place, and time.     GCS: GCS eye subscore is 4. GCS verbal subscore is 5. GCS motor subscore is 6.     Cranial Nerves: No dysarthria or facial asymmetry.     Sensory: No sensory deficit.     Motor: No weakness.     Coordination: Romberg sign negative. Coordination normal.     Comments: CN's 3-12 intact.   Psychiatric:        Mood and Affect: Mood normal.     ED Results / Procedures / Treatments   Labs (all labs ordered are listed, but only abnormal results are displayed) Labs Reviewed  BASIC METABOLIC PANEL - Abnormal; Notable for the following components:      Result Value   Glucose, Bld 155 (*)    Creatinine, Ser 1.45 (*)    All other components within normal limits  CBC - Abnormal; Notable for the following components:   WBC 13.9 (*)    All other components within normal limits  URINALYSIS, ROUTINE W REFLEX MICROSCOPIC - Abnormal; Notable for the following components:   Ketones, ur 5 (*)    Protein, ur 30 (*)    All other components within normal limits  CBG MONITORING, ED - Abnormal; Notable for the following components:   Glucose-Capillary 157 (*)    All other components within normal limits    EKG None  Radiology CT Head Wo Contrast  Result Date: 02/21/2020 CLINICAL DATA:  Headache for 3 days, associated nausea and vomiting worst in the morning, improving mid day EXAM: CT HEAD WITHOUT CONTRAST TECHNIQUE: Contiguous axial images were obtained from the base of the skull through the vertex without intravenous contrast. COMPARISON:  None. FINDINGS: Brain: No evidence of acute infarction, hemorrhage, hydrocephalus, extra-axial collection or mass lesion/mass effect. Vascular: No hyperdense vessel or unexpected calcification. Skull: No calvarial fracture or suspicious osseous lesion. No scalp swelling or hematoma. Sinuses/Orbits: Paranasal sinuses and mastoid air cells are  predominantly clear. Included orbital structures are unremarkable. Other: Slight prominence of the parotid glands. IMPRESSION: No acute intracranial findings. Symmetric prominence of the bilateral parotid glands, a nonspecific finding. Electronically Signed   By: Lovena Le M.D.   On: 02/21/2020 20:43    Procedures Procedures (including critical care time)  Medications Ordered in ED Medications  sodium chloride flush (NS) 0.9 % injection 3 mL (has no administration in time range)  metoCLOPramide (REGLAN) injection 10 mg (has no administration in time range)  dexamethasone (DECADRON) injection 10 mg (has no administration in time range)  diphenhydrAMINE (BENADRYL) injection 25 mg (has no administration in time range)    ED Course  I have reviewed the triage  vital signs and the nursing notes.  Pertinent labs & imaging results that were available during my care of the patient were reviewed by me and considered in my medical decision making (see chart for details).    MDM Rules/Calculators/A&P                      Patient to ED with c/o headache, extremity numbness, slurred speech. Detailed in HPI.   He is currently c/o only of headache. Neuro exam has no deficits. Labs are essentially unremarkable. Head CT without acute finding.   Headache cocktail ordered for pain, symptom control. Discussed the patient's presentation with Dr. Amada Jupiter, neuro, who recommends MR studies given dysarthria/headache/dizziness. These studies were ordered according to his recommendation. If negative he can be discharged home. If there is an abnormal finding, a formal neuro consultation can be requested. Appreciate his input.   MRI's delayed due to mechanical failure of scanner and multiple emergent MRI needs of the department.   On recheck, the patient is ambulatory. He states the headache cocktail provided enough pain relief and he declines need for further medications. VSS.   Patient care signed out to  Army Melia, PA-C, pending MRI studies.  Final Clinical Impression(s) / ED Diagnoses Final diagnoses:  None   1. Headache 2. Dysarthria, resolved 3. Paresthesias.   Rx / DC Orders ED Discharge Orders    None       Elpidio Anis, PA-C 02/22/20 1017    Gilda Crease, MD 02/22/20 [phone]

## 2020-02-22 NOTE — ED Provider Notes (Signed)
Medical Decision Making: Care of patient assumed from Ryan Melia, PA-C at 1500.  Agree with history, physical exam and plan.  See their note for further details.  Briefly, 29 y.o. male with PMH/PSH as below.  History reviewed. No pertinent past medical history. Past Surgical History:  Procedure Laterality Date  . SHOULDER SURGERY       Patient hemodynamically stable at handoff  Plan at time of handoff: Here for headache.  Neurology has been consulted.  MRIs have been performed and neurology recommended LP to rule out viral meningitis.  Plan is to follow-up LP results and follow-up with neurology.    ED Course LP results concerning for aseptic/viral meningitis.  Spoke with Dr. Laurence Slate with neurology who reports the patient is cleared for discharge at this time and to follow-up with his neurologist in the outpatient setting.  The HSV PCR has not resulted however Dr. Laurence Slate believes that the patient's clinical picture is not consistent with HSV encephalitis.  I discussed the findings with the patient and instructed him to call his neurologist in the morning for follow-up as soon as possible.  Strict return precautions provided.  Patient discharged in stable condition.  Clinical Impression 1. Viral meningitis   2. Acute nonintractable headache, unspecified headache type   3. Dysarthria   4. Paresthesia   5. Headache      Patient care provided under supervision of my attending, Dr. Jodi Mourning.    Myriah Boggus, [place], MD 02/23/20 [id number]    Blane Ohara, MD 02/24/20 [phone]

## 2020-02-22 NOTE — ED Provider Notes (Signed)
.  Lumbar Puncture  Date/Time: 02/22/2020 12:44 PM Performed by: Pricilla Loveless, MD Authorized by: Pricilla Loveless, MD   Consent:    Consent obtained:  Verbal and written   Consent given by:  Patient   Risks discussed:  Bleeding, headache, nerve damage, infection, repeat procedure and pain Pre-procedure details:    Procedure purpose:  Diagnostic   Preparation: Patient was prepped and draped in usual sterile fashion   Anesthesia (see MAR for exact dosages):    Anesthesia method:  Local infiltration   Local anesthetic:  Lidocaine 2% WITH epi Procedure details:    Lumbar space:  L4-L5 interspace   Patient position:  Sitting   Needle gauge:  20   Needle length (in):  3.5   Number of attempts:  3   Total volume (ml):  0 Post-procedure:    Puncture site:  Adhesive bandage applied   Patient tolerance of procedure:  Tolerated well, no immediate complications Comments:     Unable to get CSF    I attempted LP as per neurology recommendations, but the patient is morbidly obese which made this quite difficult.  We will set up for radiology LP.  He remains stable.   Pricilla Loveless, MD 02/22/20 1550

## 2020-02-22 NOTE — Consult Note (Addendum)
Requesting Physician: Dr. Jaquita Folds    Chief Complaint: Continues headache into 1 week  History obtained from: Patient and Chart    HPI:                                                                                                                                       Ryan Kramer is a 29 y.o. male with past medical history significant for obesity, obstructive sleep apnea with intermittent headaches presents to the emergency department with 1/2-week of continuous headache.  States that his headache progressively worsened and is now constant throughout the day,  worse in the mornings but improves after taking Advil worse and then later in the evening becomes a 10 out of 10 headache.  Associated with nausea and photophobia as well as neck stiffness.  He came to the emergency department because he had numbness tingling that started in his right leg that gradually went up and involve the right arm and then spread to the left side which is why he was concerned and decided to come to the emergency department.   Was evaluated in the ED yesterday, underwent MRI brain, MR venogram and MR angiogram.  MRI brain showed restriction diffusion in the splenium of the corpus callosum.  Was also informed patient has been having fever, flulike symptoms a week ago and therefore recommended lumbar puncture (-however now patient states that it was more couple of months ago.)  Received migraine cocktail, headache is now improved to a 2 out of 10 but still has a headache that he characterizes dull and heavy pressure.  Patient also states that he has not been using his CPAP for the last 1 week.  Blood pressure was only mildly elevated 150 systolic but repeat blood pressure was 138 systolic.  He has been seen at [facility] neurology for obstructive sleep apnea and associated headache.  Has not been diagnosed migraines and denies history of migraines/ unilateral throbbing headaches in the past.   History reviewed. No  pertinent past medical history.  Past Surgical History:  Procedure Laterality Date  . SHOULDER SURGERY      Family History  Problem Relation Age of Onset  . Hypertension Mother   . Hypertension Father    Social History:  reports that he has been smoking cigars. He has never used smokeless tobacco. He reports current alcohol use. He reports that he does not use drugs.  Allergies: No Known Allergies  Medications:  I reviewed home medications   ROS:                                                                                                                                     14 systems reviewed and negative except above    Examination:                                                                                                      General: Appears well-developed, obese Psych: Affect appropriate to situation Eyes: No scleral injection HENT: No OP obstrucion Head: Normocephalic.  Cardiovascular: Normal rate and regular rhythm.  Respiratory: Effort normal and breath sounds normal to anterior ascultation GI: Soft.  No distension. There is no tenderness.  Skin: WDI    Neurological Examination Mental Status: Alert, oriented, thought content appropriate.  Speech fluent without evidence of aphasia. Able to follow 3 step commands without difficulty. Cranial Nerves: II: Visual fields grossly normal,  III,IV, VI: ptosis not present, extra-ocular motions intact bilaterally, pupils equal, round, reactive to light and accommodation V,VII: smile symmetric, facial light touch sensation normal bilaterally VIII: hearing normal bilaterally IX,X: uvula rises symmetrically XI: bilateral shoulder shrug XII: midline tongue extension Motor: Right : Upper extremity   5/5    Left:     Upper extremity   5/5  Lower extremity   5/5     Lower extremity   5/5 Tone and  bulk:normal tone throughout; no atrophy noted Sensory: Pinprick and light touch intact throughout, bilaterally Deep Tendon Reflexes: 2+ and symmetric throughout Plantars: Right: downgoing   Left: downgoing Cerebellar: normal finger-to-nose, normal rapid alternating movements and normal heel-to-shin test Gait: normal gait and station     Lab Results: Basic Metabolic Panel: Recent Labs  Lab 02/21/20 2009 02/22/20 1025  NA 137 139  K 4.1 4.9  CL 101 107  CO2 23 22  GLUCOSE 155* 126*  BUN 15 15  CREATININE 1.45* 1.30*  CALCIUM 9.3 9.3    CBC: Recent Labs  Lab 02/21/20 2009  WBC 13.9*  HGB 16.3  HCT 49.7  MCV 94.0  PLT 227    Coagulation Studies: Recent Labs    02/22/20 1025  LABPROT 14.0  INR 1.1    Imaging: CT Head Wo Contrast  Result Date: 02/21/2020 CLINICAL DATA:  Headache for 3 days, associated nausea and vomiting worst in the morning, improving mid day EXAM: CT HEAD WITHOUT CONTRAST TECHNIQUE: Contiguous axial images were obtained from the base of the skull through  the vertex without intravenous contrast. COMPARISON:  None. FINDINGS: Brain: No evidence of acute infarction, hemorrhage, hydrocephalus, extra-axial collection or mass lesion/mass effect. Vascular: No hyperdense vessel or unexpected calcification. Skull: No calvarial fracture or suspicious osseous lesion. No scalp swelling or hematoma. Sinuses/Orbits: Paranasal sinuses and mastoid air cells are predominantly clear. Included orbital structures are unremarkable. Other: Slight prominence of the parotid glands. IMPRESSION: No acute intracranial findings. Symmetric prominence of the bilateral parotid glands, a nonspecific finding. Electronically Signed   By: Kreg Shropshire M.D.   On: 02/21/2020 20:43   MR ANGIO HEAD WO CONTRAST  Addendum Date: 02/22/2020   ADDENDUM REPORT: 02/22/2020 10:09 ADDENDUM: 10 mL Gadavist was administered intravenously Electronically Signed   By: Guadlupe Spanish M.D.   On: 02/22/2020  10:09   Result Date: 02/22/2020 CLINICAL DATA:  New daily headache EXAM: MRI HEAD WITHOUT CONTRAST MRA HEAD WITHOUT CONTRAST MRA NECK WITHOUT AND WITH CONTRAST MRV HEAD WITHOUT AND WITH CONTRAST TECHNIQUE: Multiplanar, multiecho pulse sequences of the brain and surrounding structures were obtained without intravenous contrast. Angiographic images of the intracranial venous structures were obtained using MRV technique without and with intravenous contrast. Angiographic images of the Circle of Willis were obtained using MRA technique without intravenous contrast. Angiographic images of the neck were obtained using MRA technique without and with intravenous contrast. Carotid stenosis measurements (when applicable) are obtained utilizing NASCET criteria, using the distal internal carotid diameter as the denominator. COMPARISON:  None. FINDINGS: MRI HEAD Brain: There is a 13 mm area of restricted diffusion involving the midline splenium of the corpus callosum. There is no intracranial hemorrhage. There is no intracranial mass, mass effect, or edema. There is no hydrocephalus or extra-axial fluid collection. No abnormal enhancement. Vascular: Major vessel flow voids at the skull base are preserved. Skull and upper cervical spine: Normal marrow signal is preserved. Sinuses/Orbits: Trace mucosal thickening.  Orbits are unremarkable. Other: Sella is unremarkable.  Mastoid air cells are clear. MRA HEAD Intracranial internal carotid arteries are patent. Middle and anterior cerebral arteries are patent. Intracranial vertebral arteries, basilar artery, posterior cerebral arteries are patent. There is no significant stenosis or aneurysm. MRV HEAD Superior sagittal sinus, straight sinus, vein of Galen, internal cerebral veins, and post transverse and sigmoid sinuses are patent. Right transverse sinus is dominant. No evidence of venous thrombosis. MRA NECK Common, internal, and external carotid arteries are patent. Extracranial  vertebral arteries are patent. There is no hemodynamically significant stenosis. IMPRESSION: Small area of abnormal signal within the midline splenium of the corpus callosum. This is a typically reversible entity with a wide differential including medications (primarily anti-seizure), infection (no additional imaging evidence of this), metabolic disease, and some associations such as vaccination. A repeat study in 2-4 weeks could be considered. Normal vascular imaging. Electronically Signed: By: Guadlupe Spanish M.D. On: 02/22/2020 09:06   MR ANGIO NECK W WO CONTRAST  Addendum Date: 02/22/2020   ADDENDUM REPORT: 02/22/2020 10:09 ADDENDUM: 10 mL Gadavist was administered intravenously Electronically Signed   By: Guadlupe Spanish M.D.   On: 02/22/2020 10:09   Result Date: 02/22/2020 CLINICAL DATA:  New daily headache EXAM: MRI HEAD WITHOUT CONTRAST MRA HEAD WITHOUT CONTRAST MRA NECK WITHOUT AND WITH CONTRAST MRV HEAD WITHOUT AND WITH CONTRAST TECHNIQUE: Multiplanar, multiecho pulse sequences of the brain and surrounding structures were obtained without intravenous contrast. Angiographic images of the intracranial venous structures were obtained using MRV technique without and with intravenous contrast. Angiographic images of the Circle of Willis were obtained using MRA  technique without intravenous contrast. Angiographic images of the neck were obtained using MRA technique without and with intravenous contrast. Carotid stenosis measurements (when applicable) are obtained utilizing NASCET criteria, using the distal internal carotid diameter as the denominator. COMPARISON:  None. FINDINGS: MRI HEAD Brain: There is a 13 mm area of restricted diffusion involving the midline splenium of the corpus callosum. There is no intracranial hemorrhage. There is no intracranial mass, mass effect, or edema. There is no hydrocephalus or extra-axial fluid collection. No abnormal enhancement. Vascular: Major vessel flow voids at the  skull base are preserved. Skull and upper cervical spine: Normal marrow signal is preserved. Sinuses/Orbits: Trace mucosal thickening.  Orbits are unremarkable. Other: Sella is unremarkable.  Mastoid air cells are clear. MRA HEAD Intracranial internal carotid arteries are patent. Middle and anterior cerebral arteries are patent. Intracranial vertebral arteries, basilar artery, posterior cerebral arteries are patent. There is no significant stenosis or aneurysm. MRV HEAD Superior sagittal sinus, straight sinus, vein of Galen, internal cerebral veins, and post transverse and sigmoid sinuses are patent. Right transverse sinus is dominant. No evidence of venous thrombosis. MRA NECK Common, internal, and external carotid arteries are patent. Extracranial vertebral arteries are patent. There is no hemodynamically significant stenosis. IMPRESSION: Small area of abnormal signal within the midline splenium of the corpus callosum. This is a typically reversible entity with a wide differential including medications (primarily anti-seizure), infection (no additional imaging evidence of this), metabolic disease, and some associations such as vaccination. A repeat study in 2-4 weeks could be considered. Normal vascular imaging. Electronically Signed: By: Macy Mis M.D. On: 02/22/2020 09:06   MR BRAIN WO CONTRAST  Addendum Date: 02/22/2020   ADDENDUM REPORT: 02/22/2020 10:09 ADDENDUM: 10 mL Gadavist was administered intravenously Electronically Signed   By: Macy Mis M.D.   On: 02/22/2020 10:09   Result Date: 02/22/2020 CLINICAL DATA:  New daily headache EXAM: MRI HEAD WITHOUT CONTRAST MRA HEAD WITHOUT CONTRAST MRA NECK WITHOUT AND WITH CONTRAST MRV HEAD WITHOUT AND WITH CONTRAST TECHNIQUE: Multiplanar, multiecho pulse sequences of the brain and surrounding structures were obtained without intravenous contrast. Angiographic images of the intracranial venous structures were obtained using MRV technique without and  with intravenous contrast. Angiographic images of the Circle of Willis were obtained using MRA technique without intravenous contrast. Angiographic images of the neck were obtained using MRA technique without and with intravenous contrast. Carotid stenosis measurements (when applicable) are obtained utilizing NASCET criteria, using the distal internal carotid diameter as the denominator. COMPARISON:  None. FINDINGS: MRI HEAD Brain: There is a 13 mm area of restricted diffusion involving the midline splenium of the corpus callosum. There is no intracranial hemorrhage. There is no intracranial mass, mass effect, or edema. There is no hydrocephalus or extra-axial fluid collection. No abnormal enhancement. Vascular: Major vessel flow voids at the skull base are preserved. Skull and upper cervical spine: Normal marrow signal is preserved. Sinuses/Orbits: Trace mucosal thickening.  Orbits are unremarkable. Other: Sella is unremarkable.  Mastoid air cells are clear. MRA HEAD Intracranial internal carotid arteries are patent. Middle and anterior cerebral arteries are patent. Intracranial vertebral arteries, basilar artery, posterior cerebral arteries are patent. There is no significant stenosis or aneurysm. MRV HEAD Superior sagittal sinus, straight sinus, vein of Galen, internal cerebral veins, and post transverse and sigmoid sinuses are patent. Right transverse sinus is dominant. No evidence of venous thrombosis. MRA NECK Common, internal, and external carotid arteries are patent. Extracranial vertebral arteries are patent. There is no hemodynamically significant stenosis.  IMPRESSION: Small area of abnormal signal within the midline splenium of the corpus callosum. This is a typically reversible entity with a wide differential including medications (primarily anti-seizure), infection (no additional imaging evidence of this), metabolic disease, and some associations such as vaccination. A repeat study in 2-4 weeks could be  considered. Normal vascular imaging. Electronically Signed: By: Guadlupe Spanish M.D. On: 02/22/2020 09:06   MR MRV HEAD W WO CONTRAST  Addendum Date: 02/22/2020   ADDENDUM REPORT: 02/22/2020 10:09 ADDENDUM: 10 mL Gadavist was administered intravenously Electronically Signed   By: Guadlupe Spanish M.D.   On: 02/22/2020 10:09   Result Date: 02/22/2020 CLINICAL DATA:  New daily headache EXAM: MRI HEAD WITHOUT CONTRAST MRA HEAD WITHOUT CONTRAST MRA NECK WITHOUT AND WITH CONTRAST MRV HEAD WITHOUT AND WITH CONTRAST TECHNIQUE: Multiplanar, multiecho pulse sequences of the brain and surrounding structures were obtained without intravenous contrast. Angiographic images of the intracranial venous structures were obtained using MRV technique without and with intravenous contrast. Angiographic images of the Circle of Willis were obtained using MRA technique without intravenous contrast. Angiographic images of the neck were obtained using MRA technique without and with intravenous contrast. Carotid stenosis measurements (when applicable) are obtained utilizing NASCET criteria, using the distal internal carotid diameter as the denominator. COMPARISON:  None. FINDINGS: MRI HEAD Brain: There is a 13 mm area of restricted diffusion involving the midline splenium of the corpus callosum. There is no intracranial hemorrhage. There is no intracranial mass, mass effect, or edema. There is no hydrocephalus or extra-axial fluid collection. No abnormal enhancement. Vascular: Major vessel flow voids at the skull base are preserved. Skull and upper cervical spine: Normal marrow signal is preserved. Sinuses/Orbits: Trace mucosal thickening.  Orbits are unremarkable. Other: Sella is unremarkable.  Mastoid air cells are clear. MRA HEAD Intracranial internal carotid arteries are patent. Middle and anterior cerebral arteries are patent. Intracranial vertebral arteries, basilar artery, posterior cerebral arteries are patent. There is no  significant stenosis or aneurysm. MRV HEAD Superior sagittal sinus, straight sinus, vein of Galen, internal cerebral veins, and post transverse and sigmoid sinuses are patent. Right transverse sinus is dominant. No evidence of venous thrombosis. MRA NECK Common, internal, and external carotid arteries are patent. Extracranial vertebral arteries are patent. There is no hemodynamically significant stenosis. IMPRESSION: Small area of abnormal signal within the midline splenium of the corpus callosum. This is a typically reversible entity with a wide differential including medications (primarily anti-seizure), infection (no additional imaging evidence of this), metabolic disease, and some associations such as vaccination. A repeat study in 2-4 weeks could be considered. Normal vascular imaging. Electronically Signed: By: Guadlupe Spanish M.D. On: 02/22/2020 09:06     Imaging: Reviewed MRI brain, MR angiogram head and neck and MR venogram  ASSESSMENT AND PLAN   Impression Headache associated with sleep apnea noncompliant with CPAP Complex migraine Abnormal MRI brain with restriction diffusion in the splenium  Recommendations -Lumbar puncture to rule out viral meningitis/aseptic meningitis -Opening pressure to check if patient has idiopathic intracranial hypertension -Urine drug screen  Addendum  LP completed -Had elevated opening pressure of 32 cmH2O -Elevated protein, cell count of 130 with lymphocytic predominance, suspect viral meningitis/aseptic meningitis-unlikely to be HSV given clinical exam.  Can follow-up with his outpatient neurologist in 2 weeks.     Daxter Paule [facility] Pager Number [phone]

## 2020-02-22 NOTE — ED Notes (Signed)
This RN called MRI for update on pt scan. Informed that scanner went down, but pt is first in queue. Will inform pt.

## 2020-02-22 NOTE — ED Notes (Signed)
Vital signs stable. Patient is resting comfortably. 

## 2020-02-22 NOTE — ED Notes (Signed)
LP setup ready at bedside.

## 2020-02-22 NOTE — ED Notes (Signed)
Rounded on pt, pt resting comfortably in bed. NAD. VSS. Informed pt will hold on family update until testing is complete. Pt verbalized understanding. Helped reposition in bed for comfort measures. Call bell in reach. Will continue to monitor.

## 2020-02-22 NOTE — ED Provider Notes (Signed)
29yo male with global headache, with dizziness, symptoms x 2 weeks, yesterday developed slurred speech, right arm and leg numbness- moved to left- then resolved. Tongue felt numb. Discussed with neuro- recommends MRI for speech changes. MRI pending, if negative, can DC, if abnormal- needs neuro consult. Given headache cocktail, normal gait, headache has improved (now 5/10), does not request more meds.  Physical Exam  BP (!) 146/87 (BP Location: Right Arm)   Pulse 85   Temp 98.7 F (37.1 C) (Oral)   Resp 18   Ht 6' (1.829 m)   Wt (!) 155.1 kg   SpO2 96%   BMI 46.38 kg/m   Physical Exam  ED Course/Procedures     Procedures  MDM  MRI returns, reviewed, will consult neurology for consult. Patient reports 80% improvement in headache at this time, no neck pain or stiffness.  Review of records, seen by allergy previously, diagnosed with sleep apnea, reports compliance with CPAP machine and headaches he was having at that time resolved, states this is a new/different headache. 2:56 PM Discussed with Dr. Laurence Slate, recommends LP after obtaining PT/PTT, COVID test, will consult on patient.  12:44 PM  Dr. Criss Alvine attempted LP, unsuccessful, requests fluro guided LP. 2:55PM Patient seen by neuro, plan for fluro guided LP to evaluate for viral/aseptic meningitis. Care will be signed out to on coming provider pending results and follow up with neuro.       Jeannie Fend, PA-C 02/22/20 1456    Pricilla Loveless, MD 02/23/20 [phone]

## 2020-02-22 NOTE — ED Notes (Signed)
Pt ambulated to and from restroom with no assistance necessary. Steady gait noted.

## 2020-02-23 ENCOUNTER — Emergency Department (HOSPITAL_COMMUNITY)
Admission: EM | Admit: 2020-02-23 | Discharge: 2020-02-23 | Disposition: A | Discharge status: Home / Self Care | Payer: Self-pay | Attending: Emergency Medicine | Admitting: Emergency Medicine

## 2020-02-23 ENCOUNTER — Other Ambulatory Visit: Payer: Self-pay

## 2020-02-23 DIAGNOSIS — R200 Anesthesia of skin: Secondary | ICD-10-CM | POA: Insufficient documentation

## 2020-02-23 DIAGNOSIS — F17290 Nicotine dependence, other tobacco product, uncomplicated: Secondary | ICD-10-CM | POA: Insufficient documentation

## 2020-02-23 DIAGNOSIS — R202 Paresthesia of skin: Secondary | ICD-10-CM

## 2020-02-23 DIAGNOSIS — A879 Viral meningitis, unspecified: Secondary | ICD-10-CM | POA: Insufficient documentation

## 2020-02-23 DIAGNOSIS — R519 Headache, unspecified: Secondary | ICD-10-CM | POA: Insufficient documentation

## 2020-02-23 DIAGNOSIS — H53149 Visual discomfort, unspecified: Secondary | ICD-10-CM | POA: Insufficient documentation

## 2020-02-23 DIAGNOSIS — R112 Nausea with vomiting, unspecified: Secondary | ICD-10-CM | POA: Insufficient documentation

## 2020-02-23 MED ORDER — OXYCODONE HCL 5 MG PO CAPS: 5.0000 mg | ORAL_CAPSULE | Freq: Four times a day (QID) | ORAL | 0 refills | Status: DC | PRN

## 2020-02-23 MED ORDER — DROPERIDOL 2.5 MG/ML IJ SOLN
1.2500 mg | Freq: Once | INTRAMUSCULAR | Status: AC
  Administered 2020-02-23: 1.25 mg via INTRAVENOUS
  Filled 2020-02-23: qty 2

## 2020-02-23 MED ORDER — SODIUM CHLORIDE 0.9 % IV BOLUS
1000.0000 mL | Freq: Once | INTRAVENOUS | Status: AC
  Administered 2020-02-23: 1000 mL via INTRAVENOUS

## 2020-02-23 MED ORDER — METOCLOPRAMIDE HCL 5 MG/ML IJ SOLN
10.0000 mg | Freq: Once | INTRAMUSCULAR | Status: AC
  Administered 2020-02-23: 10 mg via INTRAVENOUS
  Filled 2020-02-23: qty 2

## 2020-02-23 MED ORDER — DEXAMETHASONE SODIUM PHOSPHATE 10 MG/ML IJ SOLN
10.0000 mg | Freq: Once | INTRAMUSCULAR | Status: AC
  Administered 2020-02-23: 10 mg via INTRAVENOUS
  Filled 2020-02-23: qty 1

## 2020-02-23 MED ORDER — FENTANYL CITRATE (PF) 100 MCG/2ML IJ SOLN
100.0000 ug | Freq: Once | INTRAMUSCULAR | Status: AC
  Administered 2020-02-23: 100 ug via INTRAVENOUS
  Filled 2020-02-23: qty 2

## 2020-02-23 MED ORDER — ONDANSETRON 4 MG PO TBDP: 4.0000 mg | ORAL_TABLET | Freq: Three times a day (TID) | ORAL | 0 refills | Status: DC | PRN

## 2020-02-23 MED ORDER — DIPHENHYDRAMINE HCL 50 MG/ML IJ SOLN
25.0000 mg | Freq: Once | INTRAMUSCULAR | Status: AC
  Administered 2020-02-23: 25 mg via INTRAVENOUS
  Filled 2020-02-23: qty 1

## 2020-02-23 NOTE — ED Notes (Signed)
Pt provided with PO fluids.

## 2020-02-23 NOTE — ED Triage Notes (Signed)
Dx with viral meningitis yesterday, has appointment with outpatient neurology tomorrow. Here for same symptoms as yesterday-migraine and numbness in bilateral arms. Vomited x 1 in triage.

## 2020-02-23 NOTE — ED Notes (Signed)
Pt discharge, prescription, follow up education provided. Pt verbalizes understanding. Pt is alert and oriented x 4 and ambulatory at discharge.

## 2020-02-23 NOTE — ED Provider Notes (Signed)
[facility] EMERGENCY DEPARTMENT Provider Note   CSN: [id number] Arrival date & time: 02/23/20  1242     History Chief Complaint  Patient presents with  . Numbness  . Migraine    Ryan Kramer is a 29 y.o. male.  Patient with severe headache over the past 2 weeks, seen in the emergency department 2 days ago and had work-up with CT scan, MRI, lumbar puncture which demonstrated findings suggestive of aseptic/viral meningitis.  Patient was improved with administration of migraine cocktail and was discharged home.  He has neurology follow-up tomorrow.  Patient presents today with complaint of ongoing severe headache, he feels that the headache is more severe.  He states that it feels better when he sits up a bit.  It feels the same as when he presented to the emergency department.  Patient reports about a 30-minute episode of right arm and right leg numbness which then migrated to the left side for another 30 minutes and then resolved.  He denies any current numbness or weakness.  No confusion.  No vision loss.  He has vomited 2/2 HA and has associated photophobia.  Denies current fevers or head injury.        No past medical history on file.  There are no problems to display for this patient.   Past Surgical History:  Procedure Laterality Date  . SHOULDER SURGERY         Family History  Problem Relation Age of Onset  . Hypertension Mother   . Hypertension Father     Social History   Tobacco Use  . Smoking status: Current Every Day Smoker    Types: Cigars  . Smokeless tobacco: Never Used  . Tobacco comment: once a week  Substance Use Topics  . Alcohol use: Yes    Comment: 2-3 drinks a week  . Drug use: No    Home Medications Prior to Admission medications   Medication Sig Start Date End Date Taking? Authorizing Provider  ibuprofen (ADVIL) 200 MG tablet Take 200 mg by mouth every 6 (six) hours as needed for mild pain.    [provider]   meclizine (ANTIVERT) 25 MG tablet Take 1 tablet (25 mg total) by mouth 3 (three) times daily as needed for dizziness. Patient not taking: Reported on 02/22/2020 09/18/19   Hall-Potvin, [place], PA-C  pseudoephedrine-acetaminophen (TYLENOL SINUS) 30-500 MG TABS tablet Take 1 tablet by mouth every 4 (four) hours as needed (for sinus pressure).    [provider]    Allergies    Patient has no known allergies.  Review of Systems   Review of Systems  Constitutional: Negative for fever.  HENT: Negative for congestion, dental problem, rhinorrhea and sinus pressure.   Eyes: Positive for photophobia. Negative for discharge, redness and visual disturbance.  Respiratory: Negative for shortness of breath.   Cardiovascular: Negative for chest pain.  Gastrointestinal: Positive for nausea and vomiting.  Musculoskeletal: Negative for gait problem, neck pain and neck stiffness.  Skin: Negative for rash.  Neurological: Positive for numbness and headaches. Negative for syncope, speech difficulty, weakness and light-headedness.  Psychiatric/Behavioral: Negative for confusion.    Physical Exam Updated Vital Signs BP (!) 140/96 (BP Location: Right Arm)   Pulse (!) 56   Temp 98.2 F (36.8 C) (Oral)   Resp 20   SpO2 99%   Physical Exam Vitals and nursing note reviewed.  Constitutional:      Appearance: He is well-developed.  HENT:  Head: Normocephalic and atraumatic.     Right Ear: Tympanic membrane, ear canal and external ear normal.     Left Ear: Tympanic membrane, ear canal and external ear normal.     Nose: Nose normal.     Mouth/Throat:     Pharynx: Uvula midline.  Eyes:     General: Lids are normal.     Conjunctiva/sclera: Conjunctivae normal.     Pupils: Pupils are equal, round, and reactive to light.  Cardiovascular:     Rate and Rhythm: Normal rate and regular rhythm.  Pulmonary:     Effort: Pulmonary effort is normal.     Breath sounds: Normal breath sounds.    Abdominal:     Palpations: Abdomen is soft.     Tenderness: There is no abdominal tenderness.  Musculoskeletal:        General: Normal range of motion.     Cervical back: Normal range of motion and neck supple. No tenderness or bony tenderness.  Skin:    General: Skin is warm and dry.  Neurological:     Mental Status: He is alert and oriented to person, place, and time.     GCS: GCS eye subscore is 4. GCS verbal subscore is 5. GCS motor subscore is 6.     Cranial Nerves: No cranial nerve deficit.     Sensory: No sensory deficit.     Motor: No abnormal muscle tone.     Coordination: Coordination normal.     Gait: Gait normal.     Deep Tendon Reflexes: Reflexes are normal and symmetric.     Comments: Patient with normal neuro exam at time of H&P.     ED Results / Procedures / Treatments   Labs (all labs ordered are listed, but only abnormal results are displayed) Labs Reviewed - No data to display  EKG EKG Interpretation  Date/Time:  Monday February 23 2020 12:49:12 EST Ventricular Rate:  62 PR Interval:  156 QRS Duration: 80 QT Interval:  400 QTC Calculation: 406 R Axis:   60 Text Interpretation: Normal sinus rhythm Normal ECG No STEMI Confirmed by Alvester Chourifan, Matthew [phone]([id number]) on 02/23/2020 3:40:14 PM   Radiology CT Head Wo Contrast  Result Date: 02/21/2020 CLINICAL DATA:  Headache for 3 days, associated nausea and vomiting worst in the morning, improving mid day EXAM: CT HEAD WITHOUT CONTRAST TECHNIQUE: Contiguous axial images were obtained from the base of the skull through the vertex without intravenous contrast. COMPARISON:  None. FINDINGS: Brain: No evidence of acute infarction, hemorrhage, hydrocephalus, extra-axial collection or mass lesion/mass effect. Vascular: No hyperdense vessel or unexpected calcification. Skull: No calvarial fracture or suspicious osseous lesion. No scalp swelling or hematoma. Sinuses/Orbits: Paranasal sinuses and mastoid air cells are predominantly  clear. Included orbital structures are unremarkable. Other: Slight prominence of the parotid glands. IMPRESSION: No acute intracranial findings. Symmetric prominence of the bilateral parotid glands, a nonspecific finding. Electronically Signed   By: Kreg ShropshirePrice  DeHay M.D.   On: 02/21/2020 20:43   MR ANGIO HEAD WO CONTRAST  Addendum Date: 02/22/2020   ADDENDUM REPORT: 02/22/2020 10:09 ADDENDUM: 10 mL Gadavist was administered intravenously Electronically Signed   By: Guadlupe SpanishPraneil  Patel M.D.   On: 02/22/2020 10:09   Result Date: 02/22/2020 CLINICAL DATA:  New daily headache EXAM: MRI HEAD WITHOUT CONTRAST MRA HEAD WITHOUT CONTRAST MRA NECK WITHOUT AND WITH CONTRAST MRV HEAD WITHOUT AND WITH CONTRAST TECHNIQUE: Multiplanar, multiecho pulse sequences of the brain and surrounding structures were obtained without intravenous contrast. Angiographic images  of the intracranial venous structures were obtained using MRV technique without and with intravenous contrast. Angiographic images of the Circle of Willis were obtained using MRA technique without intravenous contrast. Angiographic images of the neck were obtained using MRA technique without and with intravenous contrast. Carotid stenosis measurements (when applicable) are obtained utilizing NASCET criteria, using the distal internal carotid diameter as the denominator. COMPARISON:  None. FINDINGS: MRI HEAD Brain: There is a 13 mm area of restricted diffusion involving the midline splenium of the corpus callosum. There is no intracranial hemorrhage. There is no intracranial mass, mass effect, or edema. There is no hydrocephalus or extra-axial fluid collection. No abnormal enhancement. Vascular: Major vessel flow voids at the skull base are preserved. Skull and upper cervical spine: Normal marrow signal is preserved. Sinuses/Orbits: Trace mucosal thickening.  Orbits are unremarkable. Other: Sella is unremarkable.  Mastoid air cells are clear. MRA HEAD Intracranial internal  carotid arteries are patent. Middle and anterior cerebral arteries are patent. Intracranial vertebral arteries, basilar artery, posterior cerebral arteries are patent. There is no significant stenosis or aneurysm. MRV HEAD Superior sagittal sinus, straight sinus, vein of Galen, internal cerebral veins, and post transverse and sigmoid sinuses are patent. Right transverse sinus is dominant. No evidence of venous thrombosis. MRA NECK Common, internal, and external carotid arteries are patent. Extracranial vertebral arteries are patent. There is no hemodynamically significant stenosis. IMPRESSION: Small area of abnormal signal within the midline splenium of the corpus callosum. This is a typically reversible entity with a wide differential including medications (primarily anti-seizure), infection (no additional imaging evidence of this), metabolic disease, and some associations such as vaccination. A repeat study in 2-4 weeks could be considered. Normal vascular imaging. Electronically Signed: By: Guadlupe Spanish M.D. On: 02/22/2020 09:06   MR ANGIO NECK W WO CONTRAST  Addendum Date: 02/22/2020   ADDENDUM REPORT: 02/22/2020 10:09 ADDENDUM: 10 mL Gadavist was administered intravenously Electronically Signed   By: Guadlupe Spanish M.D.   On: 02/22/2020 10:09   Result Date: 02/22/2020 CLINICAL DATA:  New daily headache EXAM: MRI HEAD WITHOUT CONTRAST MRA HEAD WITHOUT CONTRAST MRA NECK WITHOUT AND WITH CONTRAST MRV HEAD WITHOUT AND WITH CONTRAST TECHNIQUE: Multiplanar, multiecho pulse sequences of the brain and surrounding structures were obtained without intravenous contrast. Angiographic images of the intracranial venous structures were obtained using MRV technique without and with intravenous contrast. Angiographic images of the Circle of Willis were obtained using MRA technique without intravenous contrast. Angiographic images of the neck were obtained using MRA technique without and with intravenous contrast. Carotid  stenosis measurements (when applicable) are obtained utilizing NASCET criteria, using the distal internal carotid diameter as the denominator. COMPARISON:  None. FINDINGS: MRI HEAD Brain: There is a 13 mm area of restricted diffusion involving the midline splenium of the corpus callosum. There is no intracranial hemorrhage. There is no intracranial mass, mass effect, or edema. There is no hydrocephalus or extra-axial fluid collection. No abnormal enhancement. Vascular: Major vessel flow voids at the skull base are preserved. Skull and upper cervical spine: Normal marrow signal is preserved. Sinuses/Orbits: Trace mucosal thickening.  Orbits are unremarkable. Other: Sella is unremarkable.  Mastoid air cells are clear. MRA HEAD Intracranial internal carotid arteries are patent. Middle and anterior cerebral arteries are patent. Intracranial vertebral arteries, basilar artery, posterior cerebral arteries are patent. There is no significant stenosis or aneurysm. MRV HEAD Superior sagittal sinus, straight sinus, vein of Galen, internal cerebral veins, and post transverse and sigmoid sinuses are patent. Right transverse sinus  is dominant. No evidence of venous thrombosis. MRA NECK Common, internal, and external carotid arteries are patent. Extracranial vertebral arteries are patent. There is no hemodynamically significant stenosis. IMPRESSION: Small area of abnormal signal within the midline splenium of the corpus callosum. This is a typically reversible entity with a wide differential including medications (primarily anti-seizure), infection (no additional imaging evidence of this), metabolic disease, and some associations such as vaccination. A repeat study in 2-4 weeks could be considered. Normal vascular imaging. Electronically Signed: By: Macy Mis M.D. On: 02/22/2020 09:06   MR BRAIN WO CONTRAST  Addendum Date: 02/22/2020   ADDENDUM REPORT: 02/22/2020 10:09 ADDENDUM: 10 mL Gadavist was administered  intravenously Electronically Signed   By: Macy Mis M.D.   On: 02/22/2020 10:09   Result Date: 02/22/2020 CLINICAL DATA:  New daily headache EXAM: MRI HEAD WITHOUT CONTRAST MRA HEAD WITHOUT CONTRAST MRA NECK WITHOUT AND WITH CONTRAST MRV HEAD WITHOUT AND WITH CONTRAST TECHNIQUE: Multiplanar, multiecho pulse sequences of the brain and surrounding structures were obtained without intravenous contrast. Angiographic images of the intracranial venous structures were obtained using MRV technique without and with intravenous contrast. Angiographic images of the Circle of Willis were obtained using MRA technique without intravenous contrast. Angiographic images of the neck were obtained using MRA technique without and with intravenous contrast. Carotid stenosis measurements (when applicable) are obtained utilizing NASCET criteria, using the distal internal carotid diameter as the denominator. COMPARISON:  None. FINDINGS: MRI HEAD Brain: There is a 13 mm area of restricted diffusion involving the midline splenium of the corpus callosum. There is no intracranial hemorrhage. There is no intracranial mass, mass effect, or edema. There is no hydrocephalus or extra-axial fluid collection. No abnormal enhancement. Vascular: Major vessel flow voids at the skull base are preserved. Skull and upper cervical spine: Normal marrow signal is preserved. Sinuses/Orbits: Trace mucosal thickening.  Orbits are unremarkable. Other: Sella is unremarkable.  Mastoid air cells are clear. MRA HEAD Intracranial internal carotid arteries are patent. Middle and anterior cerebral arteries are patent. Intracranial vertebral arteries, basilar artery, posterior cerebral arteries are patent. There is no significant stenosis or aneurysm. MRV HEAD Superior sagittal sinus, straight sinus, vein of Galen, internal cerebral veins, and post transverse and sigmoid sinuses are patent. Right transverse sinus is dominant. No evidence of venous thrombosis. MRA  NECK Common, internal, and external carotid arteries are patent. Extracranial vertebral arteries are patent. There is no hemodynamically significant stenosis. IMPRESSION: Small area of abnormal signal within the midline splenium of the corpus callosum. This is a typically reversible entity with a wide differential including medications (primarily anti-seizure), infection (no additional imaging evidence of this), metabolic disease, and some associations such as vaccination. A repeat study in 2-4 weeks could be considered. Normal vascular imaging. Electronically Signed: By: Macy Mis M.D. On: 02/22/2020 09:06   MR MRV HEAD W WO CONTRAST  Addendum Date: 02/22/2020   ADDENDUM REPORT: 02/22/2020 10:09 ADDENDUM: 10 mL Gadavist was administered intravenously Electronically Signed   By: Macy Mis M.D.   On: 02/22/2020 10:09   Result Date: 02/22/2020 CLINICAL DATA:  New daily headache EXAM: MRI HEAD WITHOUT CONTRAST MRA HEAD WITHOUT CONTRAST MRA NECK WITHOUT AND WITH CONTRAST MRV HEAD WITHOUT AND WITH CONTRAST TECHNIQUE: Multiplanar, multiecho pulse sequences of the brain and surrounding structures were obtained without intravenous contrast. Angiographic images of the intracranial venous structures were obtained using MRV technique without and with intravenous contrast. Angiographic images of the Circle of Willis were obtained using MRA technique without  intravenous contrast. Angiographic images of the neck were obtained using MRA technique without and with intravenous contrast. Carotid stenosis measurements (when applicable) are obtained utilizing NASCET criteria, using the distal internal carotid diameter as the denominator. COMPARISON:  None. FINDINGS: MRI HEAD Brain: There is a 13 mm area of restricted diffusion involving the midline splenium of the corpus callosum. There is no intracranial hemorrhage. There is no intracranial mass, mass effect, or edema. There is no hydrocephalus or extra-axial fluid  collection. No abnormal enhancement. Vascular: Major vessel flow voids at the skull base are preserved. Skull and upper cervical spine: Normal marrow signal is preserved. Sinuses/Orbits: Trace mucosal thickening.  Orbits are unremarkable. Other: Sella is unremarkable.  Mastoid air cells are clear. MRA HEAD Intracranial internal carotid arteries are patent. Middle and anterior cerebral arteries are patent. Intracranial vertebral arteries, basilar artery, posterior cerebral arteries are patent. There is no significant stenosis or aneurysm. MRV HEAD Superior sagittal sinus, straight sinus, vein of Galen, internal cerebral veins, and post transverse and sigmoid sinuses are patent. Right transverse sinus is dominant. No evidence of venous thrombosis. MRA NECK Common, internal, and external carotid arteries are patent. Extracranial vertebral arteries are patent. There is no hemodynamically significant stenosis. IMPRESSION: Small area of abnormal signal within the midline splenium of the corpus callosum. This is a typically reversible entity with a wide differential including medications (primarily anti-seizure), infection (no additional imaging evidence of this), metabolic disease, and some associations such as vaccination. A repeat study in 2-4 weeks could be considered. Normal vascular imaging. Electronically Signed: By: Guadlupe Spanish M.D. On: 02/22/2020 09:06   DG FLUORO GUIDE LUMBAR PUNCTURE  Result Date: 02/22/2020 CLINICAL DATA:  3 day history of headaches, nausea and vomiting and dizziness. EXAM: DIAGNOSTIC LUMBAR PUNCTURE UNDER FLUOROSCOPIC GUIDANCE FLUOROSCOPY TIME:  Fluoroscopy Time:  1 minutes and 42 seconds Radiation Exposure Index (if provided by the fluoroscopic device): 64.6 mGy Number of Acquired Spot Images: 0 PROCEDURE: Informed consent was obtained from the patient prior to the procedure, including potential complications of headache, allergy, and pain. With the patient prone, the lower back was  prepped with Betadine. 1% Lidocaine was used for local anesthesia. Lumbar puncture was performed at the L2-3 level using a 20 gauge needle with return of initially bloody then clear CSF with an opening pressure of 32 cm water. 10.0 ml of CSF were obtained for laboratory studies. The patient tolerated the procedure well and there were no apparent complications. IMPRESSION: 1. Elevated opening pressure of 32 cm H2O. 2. 10 cc clear CSF obtained for appropriate laboratory studies. 3. Patient tolerated the procedure well without immediate complications. Electronically Signed   By: Rudie Meyer M.D.   On: 02/22/2020 15:43    Procedures Procedures (including critical care time)  Medications Ordered in ED Medications  metoCLOPramide (REGLAN) injection 10 mg (10 mg Intravenous Given 02/23/20 1653)  diphenhydrAMINE (BENADRYL) injection 25 mg (25 mg Intravenous Given 02/23/20 1653)  sodium chloride 0.9 % bolus 1,000 mL (0 mLs Intravenous Stopped 02/23/20 1906)  fentaNYL (SUBLIMAZE) injection 100 mcg (100 mcg Intravenous Given 02/23/20 1905)  dexamethasone (DECADRON) injection 10 mg (10 mg Intravenous Given 02/23/20 1838)  droperidol (INAPSINE) 2.5 MG/ML injection 1.25 mg (1.25 mg Intravenous Given 02/23/20 1906)    ED Course  I have reviewed the triage vital signs and the nursing notes.  Pertinent labs & imaging results that were available during my care of the patient were reviewed by me and considered in my medical decision making (see chart  for details).  Patient seen and examined.  Reviewed notes from previous hospitalization as well as lab work, imaging.  Will hydrate and give medication for headache.  Discussed with Dr. Madilyn Hook.   Vital signs reviewed and are as follows: BP (!) 140/96 (BP Location: Right Arm)   Pulse (!) 56   Temp 98.2 F (36.8 C) (Oral)   Resp 20   SpO2 99%   6:22 PM patient rechecked.  He had an hour he is feeling better, however headache is returning.  Decadron, fentanyl  ordered.  8:11 PM Added droperidol as well.    Patient states that his headache is 50% improved.  He has an appointment tomorrow at 11 AM with his neurologist.  He plans on following up then.  He is comfortable with discharge to home at this time.  No numbness at time of discharge.  Patient sits up in bed without any difficulty or distress.  Patient counseled to return if they have weakness in their arms or legs, slurred speech, trouble walking or talking, confusion, trouble with their balance, or if they have any other concerns. Patient verbalizes understanding and agrees with plan.      MDM Rules/Calculators/A&P                      Patient presents with ongoing headache and one episode of vomiting earlier upon arrival.  Patient was diagnosed with aseptic meningitis yesterday.  He has a normal neurological exam.  No significant meningismus.  Extensive evaluation yesterday and neuro follow-up tomorrow.  Patient had some transient paresthesias earlier.  Feel that he is safe for discharge to home at this time with appropriate follow-up.  Return instructions as above.    Final Clinical Impression(s) / ED Diagnoses Final diagnoses:  Bad headache  Paresthesia  Viral meningitis    Rx / DC Orders ED Discharge Orders         Ordered    oxycodone (OXY-IR) 5 MG capsule  Every 6 hours PRN     02/23/20 2008    ondansetron (ZOFRAN ODT) 4 MG disintegrating tablet  Every 8 hours PRN     02/23/20 2008           Renne Crigler, Cordelia Poche 02/23/20 2013    Tilden Fossa, MD 02/23/20 2047

## 2020-02-23 NOTE — Discharge Instructions (Signed)
Please read and follow all provided instructions.  Your diagnoses today include:  1. Bad headache   2. Paresthesia   3. Viral meningitis     Tests performed today include:  Vital signs. See below for your results today.   Follow-up instructions: Please follow-up with your neurologist tomorrow as planned.   Return instructions:   Please return to the Emergency Department if you experience worsening symptoms.  Return if the medications do not resolve your headache, if it recurs, or if you have multiple episodes of vomiting or cannot keep down fluids.  Return if you have a change from the usual headache.  RETURN IMMEDIATELY IF you:  Develop a sudden, severe headache  Develop confusion or become poorly responsive or faint  Develop a fever above 100.18F or problem breathing  Have a change in speech, vision, swallowing, or understanding  Develop new weakness, numbness, tingling, incoordination in your arms or legs  Have a seizure  Please return if you have any other emergent concerns.  Additional Information:  Your vital signs today were: BP (!) 140/96 (BP Location: Right Arm)   Pulse (!) 56   Temp 98.2 F (36.8 C) (Oral)   Resp 20   SpO2 99%  If your blood pressure (BP) was elevated above 135/85 this visit, please have this repeated by your doctor within one month. --------------

## 2020-02-24 ENCOUNTER — Encounter: Payer: Self-pay | Admitting: Emergency Medicine

## 2020-02-24 ENCOUNTER — Other Ambulatory Visit: Payer: Self-pay

## 2020-02-24 ENCOUNTER — Telehealth (INDEPENDENT_AMBULATORY_CARE_PROVIDER_SITE_OTHER): Payer: Self-pay | Admitting: Emergency Medicine

## 2020-02-24 VITALS — BP 132/79 | Ht 72.0 in | Wt 341.0 lb

## 2020-02-24 DIAGNOSIS — A879 Viral meningitis, unspecified: Secondary | ICD-10-CM

## 2020-02-24 DIAGNOSIS — G971 Other reaction to spinal and lumbar puncture: Secondary | ICD-10-CM

## 2020-02-24 DIAGNOSIS — G4452 New daily persistent headache (NDPH): Secondary | ICD-10-CM

## 2020-02-24 LAB — PATHOLOGIST SMEAR REVIEW

## 2020-02-24 LAB — HSV DNA BY PCR (REFERENCE LAB)
HSV 1 DNA: NEGATIVE
HSV 2 DNA: NEGATIVE

## 2020-02-24 NOTE — Patient Instructions (Signed)
° ° ° °  If you have lab work done today you will be contacted with your lab results within the next 2 weeks.  If you have not heard from us then please contact us. The fastest way to get your results is to register for My Chart. ° ° °IF you received an x-ray today, you will receive an invoice from [facility]. Please contact [facility] at [phone] with questions or concerns regarding your invoice.  ° °IF you received labwork today, you will receive an invoice from [facility]. Please contact [facility] at [phone] with questions or concerns regarding your invoice.  ° °Our billing staff will not be able to assist you with questions regarding bills from these companies. ° °You will be contacted with the lab results as soon as they are available. The fastest way to get your results is to activate your My Chart account. Instructions are located on the last page of this paperwork. If you have not heard from us regarding the results in 2 weeks, please contact this office. °  ° ° ° °

## 2020-02-24 NOTE — Progress Notes (Signed)
Telemedicine Encounter- SOAP NOTE Established Patient MyChart video encounter. This video telephone encounter was conducted with the patient's (or proxy's) verbal consent via video audio telecommunications: yes/no: Yes Patient was instructed to have this encounter in a suitably private space; and to only have persons present to whom they give permission to participate. In addition, patient identity was confirmed by use of name plus two identifiers (DOB and address).  I discussed the limitations, risks, security and privacy concerns of performing an evaluation and management service by telephone and the availability of in person appointments. I also discussed with the patient that there may be a patient responsible charge related to this service. The patient expressed understanding and agreed to proceed.  I spent a total of TIME; 0 MIN TO 60 MIN: 15 minutes talking with the patient or their proxy.  Chief Complaint  Patient presents with  . Headache    x 1 week per pt he was at ER for the last 3 days (3/6 and 02/23/20, pain moves from eyes, temporal and neck  . Nausea    with vomiting    Subjective   Ryan Kramer is a 29 y.o. male established patient. Telephone visit today for follow-up of headache.  Recently seen in the emergency department and diagnosed with viral meningitis.  Had CT scan and MRI of brain done as well as lumbar puncture.  Return to the emergency department 2 days later with increased headache.  Treated and released.  Has appointment to see neurologist today. States that he feels "a lot better today".  HPI   There are no problems to display for this patient.   No past medical history on file.  Current Outpatient Medications  Medication Sig Dispense Refill  . ibuprofen (ADVIL) 200 MG tablet Take 200 mg by mouth every 6 (six) hours as needed for mild pain.    Marland Kitchen ondansetron (ZOFRAN ODT) 4 MG disintegrating tablet Take 1 tablet (4 mg total) by mouth every 8  (eight) hours as needed for nausea or vomiting. 10 tablet 0  . oxycodone (OXY-IR) 5 MG capsule Take 1 capsule (5 mg total) by mouth every 6 (six) hours as needed for pain. 5 capsule 0  . meclizine (ANTIVERT) 25 MG tablet Take 1 tablet (25 mg total) by mouth 3 (three) times daily as needed for dizziness. (Patient not taking: Reported on 02/24/2020) 30 tablet 0  . pseudoephedrine-acetaminophen (TYLENOL SINUS) 30-500 MG TABS tablet Take 1 tablet by mouth every 4 (four) hours as needed (for sinus pressure).     No current facility-administered medications for this visit.    No Known Allergies  Social History   Socioeconomic History  . Marital status: Single    Spouse name: Not on file  . Number of children: Not on file  . Years of education: Not on file  . Highest education level: Not on file  Occupational History  . Not on file  Tobacco Use  . Smoking status: Current Every Day Smoker    Types: Cigars  . Smokeless tobacco: Never Used  . Tobacco comment: once a week  Substance and Sexual Activity  . Alcohol use: Yes    Comment: 2-3 drinks a week  . Drug use: No  . Sexual activity: Not on file  Other Topics Concern  . Not on file  Social History Narrative  . Not on file   Social Determinants of Health   Financial Resource Strain:   . Difficulty of Paying Living Expenses:  Not on file  Food Insecurity:   . Worried About Programme researcher, broadcasting/film/video in the Last Year: Not on file  . Ran Out of Food in the Last Year: Not on file  Transportation Needs:   . Lack of Transportation (Medical): Not on file  . Lack of Transportation (Non-Medical): Not on file  Physical Activity:   . Days of Exercise per Week: Not on file  . Minutes of Exercise per Session: Not on file  Stress:   . Feeling of Stress : Not on file  Social Connections:   . Frequency of Communication with Friends and Family: Not on file  . Frequency of Social Gatherings with Friends and Family: Not on file  . Attends Religious  Services: Not on file  . Active Member of Clubs or Organizations: Not on file  . Attends Banker Meetings: Not on file  . Marital Status: Not on file  Intimate Partner Violence:   . Fear of Current or Ex-Partner: Not on file  . Emotionally Abused: Not on file  . Physically Abused: Not on file  . Sexually Abused: Not on file    Review of Systems  Constitutional: Negative.  Negative for chills and fever.  HENT: Negative.  Negative for congestion, nosebleeds and sore throat.   Respiratory: Negative.  Negative for cough and shortness of breath.   Cardiovascular: Negative.  Negative for chest pain and palpitations.  Gastrointestinal: Negative.  Negative for abdominal pain, diarrhea, nausea and vomiting.  Genitourinary: Negative.  Negative for dysuria and hematuria.  Musculoskeletal: Negative.  Negative for back pain and myalgias.  Skin: Negative.  Negative for rash.  Neurological: Positive for headaches. Negative for dizziness, sensory change, speech change, focal weakness, seizures and loss of consciousness.  Endo/Heme/Allergies: Negative.   All other systems reviewed and are negative.   Objective  Alert and oriented x3 in no apparent respiratory distress. Vitals as reported by the patient: Today's Vitals   02/24/20 1100  BP: 132/79  Weight: (!) 341 lb (154.7 kg)  Height: 6' (1.829 m)    There are no diagnoses linked to this encounter. Brittin was seen today for headache and nausea.  Diagnoses and all orders for this visit:  Meningitis, viral Comments: Improving  New daily persistent headache Comments: Much improved  Post lumbar puncture headache Comments: Suspected contributor   Clinically stable.  No red flag signs or symptoms.  Improving. Has neurology appointment today. Advised to continue present treatment and contact the office if any problems.  I discussed the assessment and treatment plan with the patient. The patient was provided an  opportunity to ask questions and all were answered. The patient agreed with the plan and demonstrated an understanding of the instructions.   The patient was advised to call back or seek an in-person evaluation if the symptoms worsen or if the condition fails to improve as anticipated.  I provided 15 minutes of non-face-to-face time during this encounter.  Georgina Quint, MD  Primary Care at [facility]

## 2020-02-25 LAB — CSF CULTURE W GRAM STAIN
Culture: NO GROWTH
Special Requests: NORMAL

## 2020-02-26 ENCOUNTER — Institutional Professional Consult (permissible substitution): Payer: Self-pay | Admitting: Neurology

## 2020-03-01 ENCOUNTER — Telehealth: Payer: Self-pay | Admitting: Emergency Medicine

## 2020-03-01 NOTE — Telephone Encounter (Signed)
Patient requesting a medication for migraine, nausea and vomiting. Patient was just seen for meninginitis.

## 2020-03-01 NOTE — Telephone Encounter (Signed)
Pt mother called regarding the last visit on 3/9 having meninginitis. She is wanting to know if she can have the provider send more medication to help with his head aches and nausea until his neruo office visit on 03/10/20. [phone]. Please advise.

## 2020-03-02 NOTE — Telephone Encounter (Signed)
He was also recently seen by his neurologist.  He should contact them.  Thanks.

## 2020-03-02 NOTE — Telephone Encounter (Signed)
Please Advise on this. Pt was seen on 02/24/2020

## 2020-03-02 NOTE — Telephone Encounter (Signed)
LVM for pt to let him know to contact his Neurologist for some medication to help with his symptoms.

## 2020-03-03 ENCOUNTER — Encounter: Payer: Self-pay | Admitting: Neurology

## 2020-03-03 ENCOUNTER — Encounter: Payer: Self-pay | Admitting: Emergency Medicine

## 2020-03-03 NOTE — Telephone Encounter (Signed)
Pt calling to see if there is any update on medication  Please advise

## 2020-03-04 NOTE — Telephone Encounter (Signed)
Lvm for pt to let him know that Dr. Alvy Bimler said that he would have to F/U with neuro to get something to help with headaches and nausea.

## 2020-03-04 NOTE — Telephone Encounter (Signed)
He needs to contact his neurologist for this problem.  Thanks.

## 2020-03-10 ENCOUNTER — Encounter: Payer: Self-pay | Admitting: Neurology

## 2020-03-10 ENCOUNTER — Other Ambulatory Visit: Payer: Self-pay

## 2020-03-10 ENCOUNTER — Telehealth: Payer: Self-pay | Admitting: Neurology

## 2020-03-10 ENCOUNTER — Ambulatory Visit: Payer: Self-pay | Admitting: Neurology

## 2020-03-10 VITALS — BP 133/96 | HR 81 | Temp 97.1°F | Ht 72.0 in | Wt 327.5 lb

## 2020-03-10 DIAGNOSIS — R519 Headache, unspecified: Secondary | ICD-10-CM

## 2020-03-10 DIAGNOSIS — G479 Sleep disorder, unspecified: Secondary | ICD-10-CM

## 2020-03-10 DIAGNOSIS — Z8661 Personal history of infections of the central nervous system: Secondary | ICD-10-CM

## 2020-03-10 DIAGNOSIS — G4733 Obstructive sleep apnea (adult) (pediatric): Secondary | ICD-10-CM

## 2020-03-10 MED ORDER — ONDANSETRON 4 MG PO TBDP: 4.0000 mg | ORAL_TABLET | Freq: Three times a day (TID) | ORAL | 0 refills | Status: DC | PRN

## 2020-03-10 MED ORDER — AMITRIPTYLINE HCL 25 MG PO TABS: ORAL_TABLET | ORAL | 3 refills | Status: DC

## 2020-03-10 NOTE — Progress Notes (Signed)
Subjective:    Patient ID: Ryan Kramer is a 29 y.o. male.  HPI     Interim history:   Mr. Mcgath is a 29 year old right-handed gentleman with an underlying benign medical history other than morbid obesity with a BMI of over 79, who presents for follow-up consultation of his obstructive sleep apnea and also recent emergency room visit for increase in headaches and diagnosis of viral meningitis on 02/22/20.  He is accompanied by his mother today.  He missed an appointment for sleep apnea follow-up in January 2021. He missed an appointment on 02/26/20.  I first met him on 10/06/2019 at the request of his primary care physician, at which time the patient reported intermittent headaches, snoring and daytime somnolence.  He was advised to proceed with a sleep study.  He had a baseline sleep study on 11/01/2019 and I reviewed the results: mild obstructive sleep apnea, moderate during supine sleep, severe in REM sleep with a total AHI of 13.8/hour, REM AHI of 36.7/hour, supine AHI of 17.9/hour, and O2 nadir of 83%. We called him with his test results and he was advised to start AutoPap therapy due to his history of headaches and daytime somnolence reported.  He recently presented to the emergency room on 02/21/2020 with a 2-week history of headaches and he started having numbness in his extremities.  He was treated symptomatically with a migraine cocktail.  He had multiple imaging tests.  He had a head CT without contrast on 02/21/2020 and I reviewed the results: IMPRESSION: No acute intracranial findings.   Symmetric prominence of the bilateral parotid glands, a nonspecific finding. 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 This is a typically reversible  entity with a wide differential including medications (primarily anti-seizure), infection (no additional imaging evidence of this), metabolic disease, and some associations such as vaccination. A repeat study in 2-4 weeks could be considered.  Normal vascular imaging.  He had a neurology consultation in the emergency room on 02/22/2020 and reported neck stiffness.  He had a lumbar puncture which showed an opening pressure of 32 cm, elevated protein and elevated white cell count with lymphocytic predominance.   He was diagnosed with viral meningitis.  He presented to the emergency room on 02/23/2020 with ongoing headaches.  He was treated with IV hydration, Decadron, fentanyl and droperidol.  He had a telemedicine follow-up appointment with his primary care physician on 02/24/2020 and reported feeling improved.  CSF culture was negative.  HSV PCR for HSV 1 and 2 was negative in the CSF.  Today, 03/10/2020: I reviewed his AutoPap compliance data from 02/03/20 through 03/03/20, which is a total of 30 days, during which time he used his autoPAP 2 days.   He reports a generalized headache.  His mother provides most of his history.  She reports that he has had nausea, he is in bed most of the day.  She reports that he has taken Tylenol and Motrin with little relief.  He reports that he stopped using his AutoPap.  He did not have any problems tolerating the pressure of the mask.  He indicates that he has a full facemask.  He would be willing to restart his AutoPap.  His mother would like for him to have  something for pain.  I explained to her that at the most we can provide a Toradol injection in the office but she also reported that he had been taking 600 to 800 mg of ibuprofen and 2 extra strength Tylenol alternating, which is why I did not feel comfortable adding yet another nonsteroidal anti-inflammatory medication by injection in the office today.  She did request a prescription for his nausea medicine, he  had been given a prescription for Zofran which I reviewed. He denies any one-sided weakness or numbness or tingling. She is requesting a brain MRI with contrast.  I explained to her that I would be happy to order a repeat brain MRI with and without contrast but in the outpatient setting it is always slower even with an urgent request than in the inpatient setting or ER setting.  He drinks about 5 or 6 cups of water per day, he does not drink any sodas or caffeine currently.  He is not sleeping very well.  His mother reports that he has not slept well in the past 3 to 4 weeks.  Previously:   10/06/2019: (He) reports snoring and excessive daytime somnolence, intermittent headaches.  I reviewed your office note from 09/24/2019.  He was recently tested for COVID-19 and tested negative.  His blood pressure is elevated today but was noted to be normal at his visit with you.  He reports difficulty initiating and maintaining sleep for the past 2 years.  His snoring has been noted by his uncle and his mother.  He lives with his mother, paternal uncle has sleep apnea and uses a CPAP machine.  Patient reports that he has been told he has stops in his breathing.  He also endorses restless leg symptoms and kicking in his sleep.  He has a TV in the bedroom and does not always leave it on at night but sometimes it stays on.  He has no pets in the household.  He estimates that he gets about 4 to 5 hours of sleep on any given night.  He has tried melatonin which did not help.  He does not currently drink any caffeine.  He has reduced his alcohol intake and reports that he was drinking quite heavily.  He is now drinking approximately half a bottle of liquor once or twice a week.  He used to drink daily.  He smokes occasional cigars, no cigarettes.  He is trying to lose weight and has lost some weight in the past 2 months but in the past year, he had gained about 80 pounds.  Bedtime is generally between 10 and 10:30 PM and rise  time is between 6 and 6:30 AM.  He has had rare morning headaches, he has nocturia about 2-4 times per average night.  Epworth sleepiness score is 9 out of 24, fatigue severity score is 43 out of 63.  His Past Medical History Is Significant For: History reviewed. No pertinent past medical history.  His Past Surgical History Is Significant For: Past Surgical History:  Procedure Laterality Date  . SHOULDER SURGERY      His Family History Is Significant For: Family History  Problem Relation Age of Onset  . Hypertension Mother   . Hypertension Father     His Social History Is Significant For: Social History   Socioeconomic History  . Marital status: Single    Spouse name: Not on file  . Number of children: Not on file  . Years of education: Not on file  .  Highest education level: Not on file  Occupational History  . Not on file  Tobacco Use  . Smoking status: Current Every Day Smoker    Types: Cigars  . Smokeless tobacco: Never Used  . Tobacco comment: once a week  Substance and Sexual Activity  . Alcohol use: Yes    Comment: 2-3 drinks a week  . Drug use: No  . Sexual activity: Not on file  Other Topics Concern  . Not on file  Social History Narrative  . Not on file   Social Determinants of Health   Financial Resource Strain:   . Difficulty of Paying Living Expenses:   Food Insecurity:   . Worried About Charity fundraiser in the Last Year:   . Arboriculturist in the Last Year:   Transportation Needs:   . Film/video editor (Medical):   Marland Kitchen Lack of Transportation (Non-Medical):   Physical Activity:   . Days of Exercise per Week:   . Minutes of Exercise per Session:   Stress:   . Feeling of Stress :   Social Connections:   . Frequency of Communication with Friends and Family:   . Frequency of Social Gatherings with Friends and Family:   . Attends Religious Services:   . Active Member of Clubs or Organizations:   . Attends Archivist Meetings:    Marland Kitchen Marital Status:     His Allergies Are:  No Known Allergies:   His Current Medications Are:  Outpatient Encounter Medications as of 03/10/2020  Medication Sig  . ibuprofen (ADVIL) 200 MG tablet Take 200 mg by mouth every 6 (six) hours as needed for mild pain.  Marland Kitchen ondansetron (ZOFRAN ODT) 4 MG disintegrating tablet Take 1 tablet (4 mg total) by mouth every 8 (eight) hours as needed for nausea or vomiting.  . pseudoephedrine-acetaminophen (TYLENOL SINUS) 30-500 MG TABS tablet Take 1 tablet by mouth every 4 (four) hours as needed (for sinus pressure).  . [DISCONTINUED] meclizine (ANTIVERT) 25 MG tablet Take 1 tablet (25 mg total) by mouth 3 (three) times daily as needed for dizziness. (Patient not taking: Reported on 03/10/2020)  . [DISCONTINUED] oxycodone (OXY-IR) 5 MG capsule Take 1 capsule (5 mg total) by mouth every 6 (six) hours as needed for pain. (Patient not taking: Reported on 03/10/2020)   No facility-administered encounter medications on file as of 03/10/2020.  :  Review of Systems:  Out of a complete 14 point review of systems, all are reviewed and negative with the exception of these symptoms as listed below:      Review of Systems  Constitutional: Negative.   HENT: Negative.   Eyes: Negative.   Respiratory: Negative.   Cardiovascular: Negative.   Gastrointestinal: Negative.   Endocrine: Negative.   Genitourinary: Negative.   Musculoskeletal: Negative.   Allergic/Immunologic: Negative.   Neurological: Positive for headaches.  Hematological: Negative.   Psychiatric/Behavioral: Negative.     Objective:  Neurological Exam  Physical Exam Physical Examination:   Vitals:   03/10/20 0826  BP: (!) 133/96  Pulse: 81  Temp: (!) 97.1 F (36.2 C)   General Examination: The patient is a very pleasant 29 y.o. male in no acute distress. He appears well-developed and well-nourished and well groomed.  No photophobia, no orthostatic lightheadedness.  HEENT:  Normocephalic, atraumatic, pupils are equal, round and reactive to light.  Funduscopic exam is unremarkable.  Extraocular tracking is good without limitation to gaze excursion or nystagmus noted. Normal smooth pursuit is noted.  Hearing is grossly intact. Face is symmetric with normal facial animation and normal facial sensation. Speech is clear with no dysarthria noted. There is no hypophonia. There is no lip, neck/head, jaw or voice tremor. Neck is supple with full range of motion. There are no carotid bruits on auscultation. Oropharynx exam reveals: Moderate to severe mouth dryness, adequate dental hygiene and moderate airway crowding, tongue protrudes centrally in palate elevates symmetrically.  Chest: Clear to auscultation without wheezing, rhonchi or crackles noted.  Heart: S1+S2+0, regular and normal without murmurs, rubs or gallops noted.   Abdomen: Soft, non-tender and non-distended with normal bowel sounds appreciated on auscultation.  Extremities: There is no pitting edema in the distal lower extremities bilaterally.   Skin: Warm and dry without trophic changes noted.  Musculoskeletal: exam reveals no obvious joint deformities, tenderness or joint swelling or erythema.   Neurologically:  Mental status: The patient is awake, but is not providing most of his history, his mother provides most of his history and has a tendency to talk on his behalf.  He answers with short sentences, speech is scant.  He is slow in responding at times, not lethargic.  No dysarthria. Affect is difficult to assess.   Cranial nerves II - XII are as described above under HEENT exam. In addition: shoulder shrug is normal with equal shoulder height noted. Motor exam: Normal bulk, strength and tone is noted. There is no drift, tremor or rebound. Romberg is negative. Reflexes are 1+ in the UEs and trace in the LEs. Fine motor skills and coordination: intact with no one sided abn.  Cerebellar testing: No  dysmetria or intention tremor on finger to nose testing. There is no truncal or gait ataxia.  Sensory exam: intact to light touch.  Gait, station and balance: He stands easily. No veering to one side is noted. No leaning to one side is noted. Posture is age-appropriate and stance is narrow based. Gait shows normal stride length and normal pace. No problems turning are noted. Tandem walk is doable, but slower.    Assessment and Plan:    In summary, RAMADAN COUEY is a 29 year old male with an underlying benign medical history other than morbid obesity with a BMI of over 2, who presents for follow-up consultation of his recurrent headaches as well as recent emergency room visit for headache escalation and viral meningitis diagnosed on 02/22/2020, and his obstructive sleep apnea.  He has not been using his AutoPap.  His neurological exam is nonfocal but he is not giving his own history very well, minimally verbal, does follow commands but at times sluggishly.  She is advised to take him to the emergency room should he have any persistent changes in mental status or severe lethargy.  She reports that he is sleep deprived and that he has been taking ibuprofen and Tylenol.  We talked about headache management.  We talked about follow-up testing.  I suggested we proceed with an EEG.  They would like to hold off and mother is insistent that we should do the MRI first.  I ordered a brain MRI with and without contrast, a repeat CMP to make sure his kidney function is okay prior to proceeding with a contrasted scan.  I did advise them repeatedly that should he have any acute escalation in his symptoms, sudden onset of one-sided weakness or numbness or tingling, sudden onset of blurry vision, severe nausea or vomiting, or altered mental status, that she would have to take him to  the emergency room.  She was offered today that I would call EMS as he was at times sluggish in responding.  She declined the EMS call.  She is  advised that the MRI even though I requested it urgently generally takes longer to be done as an outpatient and then in the inpatient or ER setting.  Should they change their mind about the EEG, I would be happy to order it.  He is advised to be compliant with his AutoPap as using his AutoPap may improve his headaches.  I met him in October 2020 at which time he reported intermittent headaches as well.  He is advised to stay well-hydrated with water, try to get 6 to 8 cups of water per day, avoid caffeine or limit his caffeine intake to 1 or 2 servings per day.  He is advised to try to get enough rest, 7 to 8 hours of sleep, while using his AutoPap.  His mother was wondering if we should repeat his lumbar puncture.  Thankfully, there was no growth on the CSF culture and a chest we was negative.  I also explained to her that on the outpatient setting a lumbar puncture for an acute issue such as meningitis or encephalitis is going to take longer than through the emergency room or inpatient setting.  Again, if he has any acute exacerbation of his symptoms they are advised to proceed to the ER.  We will start amitriptyline low-dose at 25 mg 1/2 pill qHS, with gradual titration to see if his headaches improve, it may also help and rest better at night.  I provided detailed written instructions.  We will call with the MRI result and he is advised to follow-up in about 4 weeks to see the nurse practitioner or myself. I answered all their questions today and the patient and his mother were in agreement.  I spent 40 minutes in total face-to-face time and non face to face time, in reviewing records during pre-charting, more than 50% of which was spent in counseling and coordination of care, reviewing test results, reviewing medications and treatment regimen and/or in discussing or reviewing the diagnosis of rec. HAs, OSA, viral meningitis, the prognosis and treatment options. Pertinent laboratory and imaging test results  that were available during this visit with the patient were reviewed by me and considered in my medical decision making (see chart for details).

## 2020-03-10 NOTE — Telephone Encounter (Signed)
Patient's mother accompanied pt to check-in. Pt is self-pay and wanted to be billed for services. Mother got angry when told we needed to collect some type of payment for services today and stated to me "I do not understand why this office treats people like animals." (Pt has a large unpaid balance from last year.) Pt and mother were given Angie's name and business card to call and discuss any billing issues/complaints.

## 2020-03-10 NOTE — Patient Instructions (Addendum)
As discussed, your headache may improve with using your AutoPap consistently.  Please try to get 7 to 8 hours of sleep, use your AutoPap every night.  I will prescribe for headache management Elavil (generic name: amitriptyline) 25 mg: Take half a pill daily at bedtime for one week, then one pill daily at bedtime for one week, then one and a half pills daily at bedtime for one week, then 2 pills daily at bedtime thereafter. Common side effects reported are: mouth dryness, drowsiness, confusion, dizziness.   Please try to drink water, 6 to 8 cups/day, avoid caffeine on a daily basis or limit yourself to 1 or 2 servings per day.  I will order a brain MRI with and without contrast.  Please be mindful of that even though I have asked for it as a urgent test, on an outpatient basis it may take longer than through the hospital setting or ER setting.   I would like to order an EEG, but you have decided to hold off for now. Please call if you change your mind.  Should you have any sudden onset of one-sided weakness, numbness, tingling, shortness of breath, chest pain, one-sided headache or severe headache, changes in mental state or significant lethargy, severe nausea or vomiting or blurry vision, please proceed to the ER or call 911 immediately.

## 2020-03-10 NOTE — Telephone Encounter (Signed)
self pay order faxed to [facility] for they will reach out tot he patient to schedule

## 2020-03-11 LAB — COMPREHENSIVE METABOLIC PANEL
ALT: 31 IU/L (ref 0–44)
AST: 13 IU/L (ref 0–40)
Albumin/Globulin Ratio: 1.4 (ref 1.2–2.2)
Albumin: 4.4 g/dL (ref 4.1–5.2)
Alkaline Phosphatase: 102 IU/L (ref 39–117)
BUN/Creatinine Ratio: 10 (ref 9–20)
BUN: 11 mg/dL (ref 6–20)
Bilirubin Total: 0.6 mg/dL (ref 0.0–1.2)
CO2: 21 mmol/L (ref 20–29)
Calcium: 9.9 mg/dL (ref 8.7–10.2)
Chloride: 100 mmol/L (ref 96–106)
Creatinine, Ser: 1.15 mg/dL (ref 0.76–1.27)
GFR calc Af Amer: 98 mL/min/{1.73_m2} (ref >59)
GFR calc non Af Amer: 85 mL/min/{1.73_m2} (ref >59)
Globulin, Total: 3.1 g/dL (ref 1.5–4.5)
Glucose: 127 mg/dL — ABNORMAL HIGH (ref 65–99)
Potassium: 4.9 mmol/L (ref 3.5–5.2)
Sodium: 137 mmol/L (ref 134–144)
Total Protein: 7.5 g/dL (ref 6.0–8.5)

## 2020-03-11 NOTE — Telephone Encounter (Signed)
no to the covid questions MR Brain w/wo contrast Dr. Frances Furbish Self pay.  I spoke to the patient and he is scheduled at [facility] for 03/16/20.

## 2020-03-11 NOTE — Telephone Encounter (Signed)
Ryan Kramer has been trying to reach out to the patient several times.

## 2020-03-11 NOTE — Telephone Encounter (Signed)
Noted, thank you

## 2020-03-16 ENCOUNTER — Ambulatory Visit: Payer: Self-pay

## 2020-03-16 DIAGNOSIS — Z8661 Personal history of infections of the central nervous system: Secondary | ICD-10-CM

## 2020-03-16 DIAGNOSIS — G479 Sleep disorder, unspecified: Secondary | ICD-10-CM

## 2020-03-16 DIAGNOSIS — R519 Headache, unspecified: Secondary | ICD-10-CM

## 2020-03-16 DIAGNOSIS — G4733 Obstructive sleep apnea (adult) (pediatric): Secondary | ICD-10-CM

## 2020-03-16 MED ORDER — GADOBENATE DIMEGLUMINE 529 MG/ML IV SOLN
20.0000 mL | Freq: Once | INTRAVENOUS | Status: AC | PRN
  Administered 2020-03-16: 20 mL via INTRAVENOUS

## 2020-03-17 ENCOUNTER — Encounter: Payer: Self-pay | Admitting: Neurology

## 2020-03-17 NOTE — Progress Notes (Signed)
Please advise patient that his brain MRI w/wo contrast was reported as normal.

## 2020-04-13 ENCOUNTER — Encounter: Payer: Self-pay | Admitting: Family Medicine

## 2020-04-13 ENCOUNTER — Ambulatory Visit: Payer: Self-pay | Admitting: Family Medicine

## 2020-04-13 ENCOUNTER — Other Ambulatory Visit: Payer: Self-pay

## 2020-04-13 VITALS — BP 141/103 | HR 85 | Ht 72.0 in | Wt 333.0 lb

## 2020-04-13 DIAGNOSIS — R200 Anesthesia of skin: Secondary | ICD-10-CM

## 2020-04-13 DIAGNOSIS — R519 Headache, unspecified: Secondary | ICD-10-CM

## 2020-04-13 DIAGNOSIS — H539 Unspecified visual disturbance: Secondary | ICD-10-CM

## 2020-04-13 DIAGNOSIS — R202 Paresthesia of skin: Secondary | ICD-10-CM

## 2020-04-13 DIAGNOSIS — Z8661 Personal history of infections of the central nervous system: Secondary | ICD-10-CM

## 2020-04-13 DIAGNOSIS — R2689 Other abnormalities of gait and mobility: Secondary | ICD-10-CM

## 2020-04-13 NOTE — Patient Instructions (Addendum)
Continue amitriptyline. We order an EEG per Dr Teofilo Pod previous suggestion. MRI was normal.    Please follow up closely with PCP for elevated blood pressures. Please have ophthalmology send [place] notes from your eye appt next week. I need you to uses CPAP every day for at least 4 hours every day. Call [facility] if you feel that data is not being transferred correctly.   Follow up in 3 months   General Headache Without Cause A headache is pain or discomfort that is felt around the head or neck area. There are many causes and types of headaches. In some cases, the cause may not be found. Follow these instructions at home: Watch your condition for any changes. Let your doctor know about them. Take these steps to help with your condition: Managing pain      Take over-the-counter and prescription medicines only as told by your doctor.  Lie down in a dark, quiet room when you have a headache.  If told, put ice on your head and neck area: ? Put ice in a plastic bag. ? Place a towel between your skin and the bag. ? Leave the ice on for 20 minutes, 2-3 times per day.  If told, put heat on the affected area. Use the heat source that your doctor recommends, such as a moist heat pack or a heating pad. ? Place a towel between your skin and the heat source. ? Leave the heat on for 20-30 minutes. ? Remove the heat if your skin turns bright red. This is very important if you are unable to feel pain, heat, or cold. You may have a greater risk of getting burned.  Keep lights dim if bright lights bother you or make your headaches worse. Eating and drinking  Eat meals on a regular schedule.  If you drink alcohol: ? Limit how much you use to:  0-1 drink a day for women.  0-2 drinks a day for men. ? Be aware of how much alcohol is in your drink. In the [place] one drink equals one 12 oz bottle of beer (355 mL), one 5 oz glass of wine (148 mL), or one 1 oz glass of hard liquor (44 mL).  Stop drinking  caffeine, or reduce how much caffeine you drink. General instructions   Keep a journal to find out if certain things bring on headaches. For example, write down: ? What you eat and drink. ? How much sleep you get. ? Any change to your diet or medicines.  Get a massage or try other ways to relax.  Limit stress.  Sit up straight. Do not tighten (tense) your muscles.  Do not use any products that contain nicotine or tobacco. This includes cigarettes, e-cigarettes, and chewing tobacco. If you need help quitting, ask your doctor.  Exercise regularly as told by your doctor.  Get enough sleep. This often means 7-9 hours of sleep each night.  Keep all follow-up visits as told by your doctor. This is important. Contact a doctor if:  Your symptoms are not helped by medicine.  You have a headache that feels different than the other headaches.  You feel sick to your stomach (nauseous) or you throw up (vomit).  You have a fever. Get help right away if:  Your headache gets very bad quickly.  Your headache gets worse after a lot of physical activity.  You keep throwing up.  You have a stiff neck.  You have trouble seeing.  You have trouble speaking.  You have pain in the eye or ear.  Your muscles are weak or you lose muscle control.  You lose your balance or have trouble walking.  You feel like you will pass out (faint) or you pass out.  You are mixed up (confused).  You have a seizure. Summary  A headache is pain or discomfort that is felt around the head or neck area.  There are many causes and types of headaches. In some cases, the cause may not be found.  Keep a journal to help find out what causes your headaches. Watch your condition for any changes. Let your doctor know about them.  Contact a doctor if you have a headache that is different from usual, or if your headache is not helped by medicine.  Get help right away if your headache gets very bad, you throw  up, you have trouble seeing, you lose your balance, or you have a seizure. This information is not intended to replace advice given to you by your health care provider. Make sure you discuss any questions you have with your health care provider. Document Revised: 06/24/2018 Document Reviewed: 06/24/2018 Elsevier Patient Education  [place].

## 2020-04-13 NOTE — Progress Notes (Addendum)
PATIENT: Ryan Kramer DOB: 05-Aug-1991  REASON FOR VISIT: follow up HISTORY FROM: patient  Chief Complaint  Patient presents with  . Follow-up    RM 2, alone. HAs are intermittently improved but he is still bothered by them every few days. Complaining of R eye weakness. Has not seen opthal.     HISTORY OF PRESENT ILLNESS: Today 04/13/20 SEVERUS Kramer is a 29 y.o. male here today for follow up for headaches. He did start amitriptyline. He titrated dose to '50mg'$  at bedtime. He feels that headaches are better. He does continue to have intermittent headaches (1-2 weekly), neck stiffness and right eye weakness. Eye weakness is described as a "tired feeling" with blurry vision in the evenings. No drooping of eye lid. No vision loss. He feels off balance at times. He endorses intermittent, random, generalized numbness and tingling. No obvious seizure activity. MRI was normal. Mom is asking to proceed with EEG. He tried stopping all medications to see how he felt but states that after about a week, symptoms worsened. When he resumed meds, symptoms improved. He has not seen ophthalmology. He reports having appt next week. BP is elevated today (141/103). His mom usually checks BP at home but he is unclear about readings. He thinks that last week it was 133/87. He reports using CPAP every night. He denies any concerns with CPAP machine or supplies. Compliance report dated 03/13/2020-04/11/2020 reveals that he has used CPAP 2 of the past 30 days. There was a significant leak in the 95th percentile of 41.4L/min.   HISTORY: (copied from Dr Rexene Alberts note on 03/10/2020)  Ryan Kramer is a 29 year old right-handed gentleman with an underlying benign medical history other than morbid obesity with a BMI of over 44, who presents for follow-up consultation of his obstructive sleep apnea and also recent emergency room visit for increase in headaches and diagnosis of viral meningitis on 02/22/20.  He is accompanied by his  mother today.  He missed an appointment for sleep apnea follow-up in January 2021. He missed an appointment on 02/26/20.  I first met him on 10/06/2019 at the request of his primary care physician, at which time the patient reported intermittent headaches, snoring and daytime somnolence.  He was advised to proceed with a sleep study.  He had a baseline sleep study on 11/01/2019 and I reviewed the results: mild obstructive sleep apnea, moderate during supine sleep, severe in REM sleep with a total AHI of 13.8/hour, REM AHI of 36.7/hour, supine AHI of 17.9/hour, and O2 nadir of 83%. We called him with his test results and he was advised to start AutoPap therapy due to his history of headaches and daytime somnolence reported.  He recently presented to the emergency room on 02/21/2020 with a 2-week history of headaches and he started having numbness in his extremities.  He was treated symptomatically with a migraine cocktail.  He had multiple imaging tests.  He had a head CT without contrast on 02/21/2020 and I reviewed the results: IMPRESSION: No acute intracranial findings.  Symmetric prominence of the bilateral parotid glands, a nonspecific finding. 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 This is a typically reversible entity with a wide differential including medications (primarily anti-seizure), infection (no additional imaging  evidence of this), metabolic disease, and some associations such as vaccination. A repeat study in 2-4 weeks could be considered. Normal vascular imaging.  He had a neurology consultation in the emergency room on 02/22/2020 and reported neck stiffness.  He had a lumbar puncture which showed an opening pressure of 32 cm,  elevated protein and elevated white cell count with lymphocytic predominance.   He was diagnosed with viral meningitis.  He presented to the emergency room on 02/23/2020 with ongoing headaches.  He was treated with IV hydration, Decadron, fentanyl and droperidol.  He had a telemedicine follow-up appointment with his primary care physician on 02/24/2020 and reported feeling improved.  CSF culture was negative.  HSV PCR for HSV 1 and 2 was negative in the CSF.  Today, 03/10/2020: I reviewed his AutoPap compliance data from 02/03/20 through 03/03/20, which is a total of 30 days, during which time he used his autoPAP 2 days.   He reports a generalized headache.  His mother provides most of his history.  She reports that he has had nausea, he is in bed most of the day.  She reports that he has taken Tylenol and Motrin with little relief.  He reports that he stopped using his AutoPap.  He did not have any problems tolerating the pressure of the mask.  He indicates that he has a full facemask.  He would be willing to restart his AutoPap.  His mother would like for him to have something for pain.  I explained to her that at the most we can provide a Toradol injection in the office but she also reported that he had been taking 600 to 800 mg of ibuprofen and 2 extra strength Tylenol alternating, which is why I did not feel comfortable adding yet another nonsteroidal anti-inflammatory medication by injection in the office today.  She did request a prescription for his nausea medicine, he had been given a prescription for Zofran which I reviewed. He denies any one-sided weakness or numbness or tingling. She is requesting a brain MRI with contrast.  I explained to her that I would be happy to order a repeat brain MRI with and without contrast but in the outpatient setting it is always slower even with an urgent request than in the inpatient setting or ER setting.  He drinks about 5 or 6 cups of water per day, he does  not drink any sodas or caffeine currently.  He is not sleeping very well.  His mother reports that he has not slept well in the past 3 to 4 weeks.  Previously:   10/06/2019: (He) reports snoring and excessive daytime somnolence, intermittent headaches. I reviewed your office note from 09/24/2019. He was recently tested for COVID-19 and tested negative. His blood pressure is elevated today but was noted to be normal at his visit with you. He reports difficulty initiating and maintaining sleep for the past 2 years. His snoring has been noted by his uncle and his mother. He lives with his mother, paternal uncle has sleep apnea and uses a CPAP machine. Patient reports that he has been told he has stops in his breathing. He also endorses restless leg symptoms and kicking in his sleep. He has a TV in the bedroom and does not always leave it on at night but sometimes it stays on. He has no pets in the household. He estimates that he gets about 4 to 5 hours of sleep on any given night. He has tried melatonin which did  not help. He does not currently drink any caffeine. He has reduced his alcohol intake and reports that he was drinking quite heavily. He is now drinking approximately half a bottle of liquor once or twice a week. He used to drink daily. He smokes occasional cigars, no cigarettes. He is trying to lose weight and has lost some weight in the past 2 months but in the past year, he had gained about 80 pounds. Bedtime is generally between 10 and 10:30 PM and rise time is between 6 and 6:30 AM. He has had rare morning headaches, he has nocturia about 2-4 times per average night. Epworth sleepiness score is 9 out of 24, fatigue severity score is 43 out of 63.   REVIEW OF SYSTEMS: Out of a complete 14 system review of symptoms, the patient complains only of the following symptoms, headaches, right eye weakness, imbalance, numbness, tingling and all other reviewed systems are  negative.  ALLERGIES: No Known Allergies  HOME MEDICATIONS: Outpatient Medications Prior to Visit  Medication Sig Dispense Refill  . amitriptyline (ELAVIL) 25 MG tablet 1/2 pill each bedtime x 1 week, then 1 pill nightly x 1 week, then 1 1/2 pills nightly x 1 week, then 2 pills nightly thereafter. 60 tablet 3  . ibuprofen (ADVIL) 200 MG tablet Take 200 mg by mouth every 6 (six) hours as needed for mild pain.    Marland Kitchen ondansetron (ZOFRAN ODT) 4 MG disintegrating tablet Take 1 tablet (4 mg total) by mouth every 8 (eight) hours as needed for nausea or vomiting. 20 tablet 0  . pseudoephedrine-acetaminophen (TYLENOL SINUS) 30-500 MG TABS tablet Take 1 tablet by mouth every 4 (four) hours as needed (for sinus pressure).     No facility-administered medications prior to visit.    PAST MEDICAL HISTORY: No past medical history on file.  PAST SURGICAL HISTORY: Past Surgical History:  Procedure Laterality Date  . SHOULDER SURGERY      FAMILY HISTORY: Family History  Problem Relation Age of Onset  . Hypertension Mother   . Hypertension Father     SOCIAL HISTORY: Social History   Socioeconomic History  . Marital status: Single    Spouse name: Not on file  . Number of children: Not on file  . Years of education: Not on file  . Highest education level: Not on file  Occupational History  . Not on file  Tobacco Use  . Smoking status: Current Every Day Smoker    Types: Cigars  . Smokeless tobacco: Never Used  . Tobacco comment: once a week  Substance and Sexual Activity  . Alcohol use: Yes    Comment: 2-3 drinks a week  . Drug use: No  . Sexual activity: Not on file  Other Topics Concern  . Not on file  Social History Narrative  . Not on file   Social Determinants of Health   Financial Resource Strain:   . Difficulty of Paying Living Expenses:   Food Insecurity:   . Worried About Charity fundraiser in the Last Year:   . Arboriculturist in the Last Year:   Transportation  Needs:   . Film/video editor (Medical):   Marland Kitchen Lack of Transportation (Non-Medical):   Physical Activity:   . Days of Exercise per Week:   . Minutes of Exercise per Session:   Stress:   . Feeling of Stress :   Social Connections:   . Frequency of Communication with Friends and Family:   .  Frequency of Social Gatherings with Friends and Family:   . Attends Religious Services:   . Active Member of Clubs or Organizations:   . Attends Archivist Meetings:   Marland Kitchen Marital Status:   Intimate Partner Violence:   . Fear of Current or Ex-Partner:   . Emotionally Abused:   Marland Kitchen Physically Abused:   . Sexually Abused:       PHYSICAL EXAM  Vitals:   04/13/20 1139  BP: (!) 141/103  Pulse: 85  Weight: (!) 333 lb (151 kg)  Height: 6' (1.829 m)   Body mass index is 45.16 kg/m.  Generalized: Well developed, in no acute distress  Cardiology: normal rate and rhythm, no murmur noted Respiratory: clear to auscultation bilaterally  Neurological examination  Mentation: Alert oriented to time, place, history taking. Follows all commands speech and language fluent Cranial nerve II-XII: Pupils were equal round reactive to light. Extraocular movements were full, visual field were full on confrontational test. Facial sensation and strength were normal. Head turning and shoulder shrug  were normal and symmetric. Motor: The motor testing reveals 5 over 5 strength of all 4 extremities. Good symmetric motor tone is noted throughout.  Sensory: Sensory testing is intact to soft touch on all 4 extremities. No evidence of extinction is noted.  Coordination: Cerebellar testing reveals good finger-nose-finger and heel-to-shin bilaterally.  Gait and station: Gait is normal.     DIAGNOSTIC DATA (LABS, IMAGING, TESTING) - I reviewed patient records, labs, notes, testing and imaging myself where available.  No flowsheet data found.   Lab Results  Component Value Date   WBC 13.9 (H) 02/21/2020    HGB 16.3 02/21/2020   HCT 49.7 02/21/2020   MCV 94.0 02/21/2020   PLT 227 02/21/2020      Component Value Date/Time   NA 137 03/10/2020 0925   K 4.9 03/10/2020 0925   CL 100 03/10/2020 0925   CO2 21 03/10/2020 0925   GLUCOSE 127 (H) 03/10/2020 0925   GLUCOSE 126 (H) 02/22/2020 1025   BUN 11 03/10/2020 0925   CREATININE 1.15 03/10/2020 0925   CALCIUM 9.9 03/10/2020 0925   PROT 7.5 03/10/2020 0925   ALBUMIN 4.4 03/10/2020 0925   AST 13 03/10/2020 0925   ALT 31 03/10/2020 0925   ALKPHOS 102 03/10/2020 0925   BILITOT 0.6 03/10/2020 0925   GFRNONAA 85 03/10/2020 0925   GFRAA 98 03/10/2020 0925   Lab Results  Component Value Date   CHOL 150 09/24/2019   HDL 33 (L) 09/24/2019   LDLCALC 90 09/24/2019   TRIG 153 (H) 09/24/2019   CHOLHDL 4.5 09/24/2019   Lab Results  Component Value Date   HGBA1C 5.8 (H) 09/24/2019   No results found for: [id number] Lab Results  Component Value Date   TSH 0.855 09/24/2019     ASSESSMENT AND PLAN 29 y.o. year old male  has no past medical history on file. here with     ICD-10-CM   1. History of meningitis  Z86.61   2. Imbalance  R26.89 EEG  3. Vision changes  H53.9 EEG  4. Numbness and tingling  R20.0 EEG   R20.2     Obi continues to improve following diagnosis of viral meningitis in March. He feels that, overall, symptoms have improved. Headaches are less frequent and less severe but continue to be persistent. He feels that balance has improved with increased energy. He continues to have intermittent episodes of numbness of random body parts. There is no obvious pattern  or consistent location. Blood pressure remains elevated today. He has not seen PCP recently and not on any antihypertensive agents. He reports using CPAP nightly, however, compliance data only shows 2 days where CPAP was used.  I have educated patient on the importance of blood pressure management and risk of unmanaged hypertension.  We have also discussed the risk of  untreated sleep apnea.  I have asked that he follow-up closely with his primary care provider.  Uncontrolled hypertension could be related to complaints he has today.  I have also asked that he reach out to [facility] if he feels that there are any discrepancies in the compliance data.  He is specifically requesting that we order an EEG today due to continued concerns of imbalance, headaches and vision changes.  There are no obvious seizure events.  MRI was normal.  Dr. Rexene Alberts did offer an EEG at last visit, therefore, we will place orders today.  I suspect that this will be normal.  He was encouraged to work on healthy lifestyle habits.  Well-balanced diet and regular exercise encouraged.  He will continue amitriptyline as this has been somewhat effective in reducing severity and frequency of headaches.  He denies any adverse effects.  We will have him follow-up in 3 months, sooner if needed.  He verbalizes understanding and agreement with this plan.   Orders Placed This Encounter  Procedures  . EEG    Standing Status:   Future    Standing Expiration Date:   04/13/2021    Order Specific Question:   Where should this test be performed?    Answer:   [facility]     No orders of the defined types were placed in this encounter.     I spent 40 minutes with the patient. 50% of this time was spent counseling and educating patient on plan of care and medications.    Debbora Presto, FNP-C 04/13/2020, 1:28 PM [facility] [address], [place], [place] [address] [phone]  I reviewed the above note and documentation by the Nurse Practitioner and agree with the history, exam, assessment and plan as outlined above. I was available for consultation. Star Age, MD, PhD [facility])

## 2020-05-12 ENCOUNTER — Other Ambulatory Visit: Payer: Self-pay

## 2020-05-12 ENCOUNTER — Ambulatory Visit: Payer: Self-pay | Admitting: Diagnostic Neuroimaging

## 2020-05-12 DIAGNOSIS — R2689 Other abnormalities of gait and mobility: Secondary | ICD-10-CM

## 2020-05-12 DIAGNOSIS — R410 Disorientation, unspecified: Secondary | ICD-10-CM

## 2020-05-12 DIAGNOSIS — H539 Unspecified visual disturbance: Secondary | ICD-10-CM

## 2020-05-12 DIAGNOSIS — R202 Paresthesia of skin: Secondary | ICD-10-CM

## 2020-05-14 NOTE — Procedures (Signed)
   [facility]  EEG (ELECTROENCEPHALOGRAM) REPORT   STUDY DATE: 05/12/20 PATIENT NAME: Ryan Kramer DOB: [birth year] MRN: [id number]  ORDERING CLINICIAN: Huston Foley, MD PhD   TECHNOLOGIST: Ardis Hughs  TECHNIQUE: Electroencephalogram was recorded utilizing standard 10-20 system of lead placement and reformatted into average and bipolar montages.  RECORDING TIME: 24 minutes  ACTIVATION: hyperventilation and photic stimulation  CLINICAL INFORMATION: 29 year old male with headaches and confusion.  FINDINGS: Posterior dominant background rhythms, which attenuate with eye opening, ranging 10-11 hertz and 10-15 microvolts. No focal, lateralizing, epileptiform activity or seizures are seen. Patient recorded in the awake and drowsy state. EKG channel shows regular rhythm of 70-80 beats per minute.   IMPRESSION:   Normal EEG in the awake and drowsy states.    INTERPRETING PHYSICIAN:  Suanne Marker, MD Certified in Neurology, Neurophysiology and Neuroimaging  [facility] [address], [address] [place], [place] [address] [phone]

## 2020-05-18 ENCOUNTER — Telehealth: Payer: Self-pay | Admitting: *Deleted

## 2020-05-18 NOTE — Telephone Encounter (Signed)
-----   Message from Shawnie Dapper, NP sent at 05/18/2020  7:58 AM EDT ----- EEG was normal.

## 2020-07-15 ENCOUNTER — Ambulatory Visit: Payer: Self-pay | Admitting: Family Medicine

## 2020-10-06 ENCOUNTER — Other Ambulatory Visit: Payer: Self-pay

## 2020-10-06 ENCOUNTER — Ambulatory Visit: Payer: Self-pay | Admitting: Family Medicine

## 2020-10-06 ENCOUNTER — Encounter: Payer: Self-pay | Admitting: Family Medicine

## 2020-10-06 VITALS — BP 149/97 | HR 77 | Ht 71.0 in | Wt 354.0 lb

## 2020-10-06 DIAGNOSIS — R519 Headache, unspecified: Secondary | ICD-10-CM

## 2020-10-06 DIAGNOSIS — G4733 Obstructive sleep apnea (adult) (pediatric): Secondary | ICD-10-CM

## 2020-10-06 NOTE — Progress Notes (Addendum)
Chief Complaint  Patient presents with  . Follow-up    rm 16- pt here for a f/u on meningitis. Pt has no new sx     HISTORY OF PRESENT ILLNESS: Today 10/06/20  Ryan Kramer is a 29 y.o. male here today for follow up for headaches. He has discontinued amitriptyline. He did not feel it was needed. He has not had any recent headaches. He does note some tension if he doesn't drink water. EEG was normal. No seizure like activity. He has not used CPAP in at least 2 months. He has felt well until the past week. He traveled to [place] and when he got back he had some neck tightness and his ears are popping. He feels this is related to traveling. No fevers or pain with movement of neck. No other signs of infection. He is feeling well and without complaints. He would ike to return to work and needs neurological clearance. He is followed by PCP regularly.    HISTORY (copied from my note on 04/13/2020)  Ryan Kramer is a 29 y.o. male here today for follow up for headaches. He did start amitriptyline. He titrated dose to $Remov'50mg'YqNBVQ$  at bedtime. He feels that headaches are better. He does continue to have intermittent headaches (1-2 weekly), neck stiffness and right eye weakness. Eye weakness is described as a "tired feeling" with blurry vision in the evenings. No drooping of eye lid. No vision loss. He feels off balance at times. He endorses intermittent, random, generalized numbness and tingling. No obvious seizure activity. MRI was normal. Mom is asking to proceed with EEG. He tried stopping all medications to see how he felt but states that after about a week, symptoms worsened. When he resumed meds, symptoms improved. He has not seen ophthalmology. He reports having appt next week. BP is elevated today (141/103). His mom usually checks BP at home but he is unclear about readings. He thinks that last week it was 133/87. He reports using CPAP every night. He denies any concerns with CPAP machine or  supplies. Compliance report dated 03/13/2020-04/11/2020 reveals that he has used CPAP 2 of the past 30 days. There was a significant leak in the 95th percentile of 41.4L/min.   HISTORY: (copied from Dr Rexene Alberts note on 03/10/2020)  Mr. Ryan Kramer is a 29 year old right-handed gentleman with an underlying benign medical history other than morbid obesity with a BMI of over 72, who presents for follow-up consultation of his obstructive sleep apnea and also recent emergency room visit for increase in headaches and diagnosis of viral meningitison 02/22/20.He is accompanied by his mother today. He missed an appointment for sleep apnea follow-up in January 2021. He missed an appointment on 02/26/20. I first met him on 10/06/2019 at the request of his primary care physician, at which time the patient reported intermittent headaches, snoring and daytime somnolence. He was advised to proceed with a sleep study. He had a baseline sleep study on 11/01/2019 and I reviewed the results: mild obstructive sleep apnea, moderate during supine sleep, severe in REM sleep with a total AHI of 13.8/hour, REM AHI of 36.7/hour, supine AHI of 17.9/hour, and O2 nadir of 83%. We called him with his test results and he was advised to start AutoPap therapy due to his history of headaches and daytime somnolence reported.  He recently presented to the emergency room on 02/21/2020 with a 2-week history of headaches and he started having numbness in his extremities. He was treated symptomatically with a  migraine cocktail. He had multiple imaging tests. He had a head CT without contrast on 02/21/2020 and I reviewed the results: IMPRESSION: No acute intracranial findings.  Symmetric prominence of the bilateral parotid glands, a nonspecific finding. 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 This is a typically reversible entity with a wide differential including medications (primarily anti-seizure), infection (no additional imaging evidence of this), metabolic disease, and some associations such as vaccination. A repeat study in 2-4 weeks could be considered. Normal vascular imaging.  He had a neurology consultation in the emergency room on 02/22/2020 and reported neck stiffness. He had a lumbar puncture which showed an opening pressure of 32 cm, elevated protein and elevated white cell count with lymphocytic predominance.  He was diagnosed with viral meningitis.  He presented to the emergency room on 02/23/2020 with ongoing headaches. He was treated with IV hydration, Decadron, fentanyl and droperidol.  He had a telemedicine follow-up appointment with his primary care physician on 02/24/2020 and reported feeling improved.  CSF culture was negative. HSV PCR for HSV 1 and 2 was negative in the CSF.  Today, 03/10/2020: I reviewed his AutoPap compliance data from 02/03/20 through 03/03/20, which is a total of 30 days, during which time he used his autoPAP 2 days.  He reports a generalized headache. His mother provides most of his history. She reports that he has had nausea, he is in bed most of the day. She reports that he has taken Tylenol and Motrin with little relief. He reportsthat he stopped using his AutoPap. He did not have any problems tolerating the pressure of the mask. He indicates that he has a full facemask. He would be willing to restart his AutoPap. His mother would like for him to have something for pain. I explained to her that at the most we can provide a Toradol injection in the office but she also reported that he had been taking 600 to 800 mg of ibuprofen and 2 extra strength Tylenol alternating, which is why I did not feel comfortable adding yet  another nonsteroidal anti-inflammatory medication by injection in the office today. She did request a prescription for his nausea medicine, he had been given a prescription for Zofran which I reviewed. He denies any one-sided weakness or numbness or tingling. She is requesting a brain MRI with contrast. I explained to her that I would be happy to order a repeat brain MRI with and without contrast but in the outpatient setting it is always slower even with an urgent request than in the inpatient setting or ER setting. He drinks about 5 or 6 cups of water per day, he does not drink any sodas or caffeine currently. He is not sleeping very well. His mother reports that he has not slept well in the past 3 to 4 weeks.  Previously:   10/06/2019: (He) reports snoring and excessive daytime somnolence, intermittent headaches. I reviewed your office note from 09/24/2019. He was recently tested for COVID-19 and tested negative. His blood pressure is elevated today but was noted to be normal at his visit with you. He reports difficulty initiating and maintaining sleep for the past 2 years. His snoring has been noted by his uncle and his mother. He lives with his mother,  paternal uncle has sleep apnea and uses a CPAP machine. Patient reports that he has been told he has stops in his breathing. He also endorses restless leg symptoms and kicking in his sleep. He has a TV in the bedroom and does not always leave it on at night but sometimes it stays on. He has no pets in the household. He estimates that he gets about 4 to 5 hours of sleep on any given night. He has tried melatonin which did not help. He does not currently drink any caffeine. He has reduced his alcohol intake and reports that he was drinking quite heavily. He is now drinking approximately half a bottle of liquor once or twice a week. He used to drink daily. He smokes occasional cigars, no cigarettes. He is trying to lose weight and has  lost some weight in the past 2 months but in the past year, he had gained about 80 pounds. Bedtime is generally between 10 and 10:30 PM and rise time is between 6 and 6:30 AM. He has had rare morning headaches, he has nocturia about 2-4 times per average night. Epworth sleepiness score is 9 out of 24, fatigue severity score is 43 out of 63.    REVIEW OF SYSTEMS: Out of a complete 14 system review of symptoms, the patient complains only of the following symptoms, fatigue and all other reviewed systems are negative.   ALLERGIES: No Known Allergies   HOME MEDICATIONS: Outpatient Medications Prior to Visit  Medication Sig Dispense Refill  . amitriptyline (ELAVIL) 25 MG tablet 1/2 pill each bedtime x 1 week, then 1 pill nightly x 1 week, then 1 1/2 pills nightly x 1 week, then 2 pills nightly thereafter. 60 tablet 3  . ibuprofen (ADVIL) 200 MG tablet Take 200 mg by mouth every 6 (six) hours as needed for mild pain.    Marland Kitchen ondansetron (ZOFRAN ODT) 4 MG disintegrating tablet Take 1 tablet (4 mg total) by mouth every 8 (eight) hours as needed for nausea or vomiting. 20 tablet 0  . pseudoephedrine-acetaminophen (TYLENOL SINUS) 30-500 MG TABS tablet Take 1 tablet by mouth every 4 (four) hours as needed (for sinus pressure).     No facility-administered medications prior to visit.     PAST MEDICAL HISTORY: No past medical history on file.   PAST SURGICAL HISTORY: Past Surgical History:  Procedure Laterality Date  . SHOULDER SURGERY       FAMILY HISTORY: Family History  Problem Relation Age of Onset  . Hypertension Mother   . Hypertension Father      SOCIAL HISTORY: Social History   Socioeconomic History  . Marital status: Single    Spouse name: Not on file  . Number of children: Not on file  . Years of education: Not on file  . Highest education level: Not on file  Occupational History  . Not on file  Tobacco Use  . Smoking status: Current Every Day Smoker    Types:  Cigars  . Smokeless tobacco: Never Used  . Tobacco comment: once a week  Substance and Sexual Activity  . Alcohol use: Yes    Comment: 2-3 drinks a week  . Drug use: No  . Sexual activity: Not on file  Other Topics Concern  . Not on file  Social History Narrative  . Not on file   Social Determinants of Health   Financial Resource Strain:   . Difficulty of Paying Living Expenses: Not on file  Food Insecurity:   .  Worried About Charity fundraiser in the Last Year: Not on file  . Ran Out of Food in the Last Year: Not on file  Transportation Needs:   . Lack of Transportation (Medical): Not on file  . Lack of Transportation (Non-Medical): Not on file  Physical Activity:   . Days of Exercise per Week: Not on file  . Minutes of Exercise per Session: Not on file  Stress:   . Feeling of Stress : Not on file  Social Connections:   . Frequency of Communication with Friends and Family: Not on file  . Frequency of Social Gatherings with Friends and Family: Not on file  . Attends Religious Services: Not on file  . Active Member of Clubs or Organizations: Not on file  . Attends Archivist Meetings: Not on file  . Marital Status: Not on file  Intimate Partner Violence:   . Fear of Current or Ex-Partner: Not on file  . Emotionally Abused: Not on file  . Physically Abused: Not on file  . Sexually Abused: Not on file      PHYSICAL EXAM  Vitals:   10/06/20 1054  BP: (!) 149/97  Pulse: 77  Weight: (!) 354 lb (160.6 kg)  Height: $Remove'5\' 11"'kqJLvUq$  (1.803 m)   Body mass index is 49.37 kg/m.   Generalized: Well developed, in no acute distress   Neurological examination  Mentation: Alert oriented to time, place, history taking. Follows all commands speech and language fluent Cranial nerve II-XII: Pupils were equal round reactive to light. Extraocular movements were full, visual field were full on confrontational test. Facial sensation and strength were normal. Head turning and  shoulder shrug  were normal and symmetric. Motor: The motor testing reveals 5 over 5 strength of all 4 extremities. Good symmetric motor tone is noted throughout.  Sensory: Sensory testing is intact to soft touch on all 4 extremities. No evidence of extinction is noted.  Coordination: Cerebellar testing reveals good finger-nose-finger and heel-to-shin bilaterally.  Gait and station: Gait is normal.    DIAGNOSTIC DATA (LABS, IMAGING, TESTING) - I reviewed patient records, labs, notes, testing and imaging myself where available.  Lab Results  Component Value Date   WBC 13.9 (H) 02/21/2020   HGB 16.3 02/21/2020   HCT 49.7 02/21/2020   MCV 94.0 02/21/2020   PLT 227 02/21/2020      Component Value Date/Time   NA 137 03/10/2020 0925   K 4.9 03/10/2020 0925   CL 100 03/10/2020 0925   CO2 21 03/10/2020 0925   GLUCOSE 127 (H) 03/10/2020 0925   GLUCOSE 126 (H) 02/22/2020 1025   BUN 11 03/10/2020 0925   CREATININE 1.15 03/10/2020 0925   CALCIUM 9.9 03/10/2020 0925   PROT 7.5 03/10/2020 0925   ALBUMIN 4.4 03/10/2020 0925   AST 13 03/10/2020 0925   ALT 31 03/10/2020 0925   ALKPHOS 102 03/10/2020 0925   BILITOT 0.6 03/10/2020 0925   GFRNONAA 85 03/10/2020 0925   GFRAA 98 03/10/2020 0925   Lab Results  Component Value Date   CHOL 150 09/24/2019   HDL 33 (L) 09/24/2019   LDLCALC 90 09/24/2019   TRIG 153 (H) 09/24/2019   CHOLHDL 4.5 09/24/2019   Lab Results  Component Value Date   HGBA1C 5.8 (H) 09/24/2019   No results found for: [id number] Lab Results  Component Value Date   TSH 0.855 09/24/2019      ASSESSMENT AND PLAN  29 y.o. year old male  has no past  medical history on file. here with   Recurrent headache  OSA (obstructive sleep apnea)  [place] is doing well, today. Headaches have resolved. He has not used CPAP recently. We have reviewed risks of untreated sleep apnea. He states that he wishes to resume therapy and has all needed supplies. He was encouraged to  use CPAP nightly and greater than 4 hours each night. He has gained about 20 pounds since April. He will continue close follow up with PCP for weight management and monitoring of BP. BP 149/97 today. Healthy lifestyle habits encouraged. He will follow up with me in 6 months for CPAP review. He verbalizes understanding and agreement with this plan.    I spent 20 minutes of face-to-face and non-face-to-face time with patient.  This included previsit chart review, lab review, study review, order entry, electronic health record documentation, patient education.    Debbora Presto, MSN, FNP-C 10/06/2020, 10:59 AM  [facility] [address] [place], [place] [address] [phone]  I reviewed the above note and documentation by the Nurse Practitioner and agree with the history, exam, assessment and plan as outlined above. I was available for consultation. Star Age, MD, PhD [facility])

## 2020-10-06 NOTE — Patient Instructions (Addendum)
Please continue using your CPAP regularly. While your insurance requires that you use CPAP at least 4 hours each night on 70% of the nights, I recommend, that you not skip any nights and use it throughout the night if you can. Getting used to CPAP and staying with the treatment long term does take time and patience and discipline. Untreated obstructive sleep apnea when it is moderate to severe can have an adverse impact on cardiovascular health and raise her risk for heart disease, arrhythmias, hypertension, congestive heart failure, stroke and diabetes. Untreated obstructive sleep apnea causes sleep disruption, nonrestorative sleep, and sleep deprivation. This can have an impact on your day to day functioning and cause daytime sleepiness and impairment of cognitive function, memory loss, mood disturbance, and problems focussing. Using CPAP regularly can improve these symptoms.   Stay well hydrated. Regular exercise and well balanced diet advised.   Consider talking to PCP regarding weight management. I will be happy to refer you to healthy Weight and Wellness if you wish.   Follow up for CPAP review in 6 months   Sleep Apnea Sleep apnea affects breathing during sleep. It causes breathing to stop for a short time or to become shallow. It can also increase the risk of:  Heart attack.  Stroke.  Being very overweight (obese).  Diabetes.  Heart failure.  Irregular heartbeat. The goal of treatment is to help you breathe normally again. What are the causes? There are three kinds of sleep apnea:  Obstructive sleep apnea. This is caused by a blocked or collapsed airway.  Central sleep apnea. This happens when the brain does not send the right signals to the muscles that control breathing.  Mixed sleep apnea. This is a combination of obstructive and central sleep apnea. The most common cause of this condition is a collapsed or blocked airway. This can happen if:  Your throat muscles are  too relaxed.  Your tongue and tonsils are too large.  You are overweight.  Your airway is too small. What increases the risk?  Being overweight.  Smoking.  Having a small airway.  Being older.  Being male.  Drinking alcohol.  Taking medicines to calm yourself (sedatives or tranquilizers).  Having family members with the condition. What are the signs or symptoms?  Trouble staying asleep.  Being sleepy or tired during the day.  Getting angry a lot.  Loud snoring.  Headaches in the morning.  Not being able to focus your mind (concentrate).  Forgetting things.  Less interest in sex.  Mood swings.  Personality changes.  Feelings of sadness (depression).  Waking up a lot during the night to pee (urinate).  Dry mouth.  Sore throat. How is this diagnosed?  Your medical history.  A physical exam.  A test that is done when you are sleeping (sleep study). The test is most often done in a sleep lab but may also be done at home. How is this treated?   Sleeping on your side.  Using a medicine to get rid of mucus in your nose (decongestant).  Avoiding the use of alcohol, medicines to help you relax, or certain pain medicines (narcotics).  Losing weight, if needed.  Changing your diet.  Not smoking.  Using a machine to open your airway while you sleep, such as: ? An oral appliance. This is a mouthpiece that shifts your lower jaw forward. ? A CPAP device. This device blows air through a mask when you breathe out (exhale). ? An EPAP  device. This has valves that you put in each nostril. ? A BPAP device. This device blows air through a mask when you breathe in (inhale) and breathe out.  Having surgery if other treatments do not work. It is important to get treatment for sleep apnea. Without treatment, it can lead to:  High blood pressure.  Coronary artery disease.  In men, not being able to have an erection (impotence).  Reduced thinking  ability. Follow these instructions at home: Lifestyle  Make changes that your doctor recommends.  Eat a healthy diet.  Lose weight if needed.  Avoid alcohol, medicines to help you relax, and some pain medicines.  Do not use any products that contain nicotine or tobacco, such as cigarettes, e-cigarettes, and chewing tobacco. If you need help quitting, ask your doctor. General instructions  Take over-the-counter and prescription medicines only as told by your doctor.  If you were given a machine to use while you sleep, use it only as told by your doctor.  If you are having surgery, make sure to tell your doctor you have sleep apnea. You may need to bring your device with you.  Keep all follow-up visits as told by your doctor. This is important. Contact a doctor if:  The machine that you were given to use during sleep bothers you or does not seem to be working.  You do not get better.  You get worse. Get help right away if:  Your chest hurts.  You have trouble breathing in enough air.  You have an uncomfortable feeling in your back, arms, or stomach.  You have trouble talking.  One side of your body feels weak.  A part of your face is hanging down. These symptoms may be an emergency. Do not wait to see if the symptoms will go away. Get medical help right away. Call your local emergency services (911 in the [place] Do not drive yourself to the hospital. Summary  This condition affects breathing during sleep.  The most common cause is a collapsed or blocked airway.  The goal of treatment is to help you breathe normally while you sleep. This information is not intended to replace advice given to you by your health care provider. Make sure you discuss any questions you have with your health care provider. Document Revised: 09/20/2018 Document Reviewed: 07/30/2018 Elsevier Patient Education  2020 [employer]   General Headache Without Cause A headache is pain or  discomfort that is felt around the head or neck area. There are many causes and types of headaches. In some cases, the cause may not be found. Follow these instructions at home: Watch your condition for any changes. Let your doctor know about them. Take these steps to help with your condition: Managing pain      Take over-the-counter and prescription medicines only as told by your doctor.  Lie down in a dark, quiet room when you have a headache.  If told, put ice on your head and neck area: ? Put ice in a plastic bag. ? Place a towel between your skin and the bag. ? Leave the ice on for 20 minutes, 2-3 times per day.  If told, put heat on the affected area. Use the heat source that your doctor recommends, such as a moist heat pack or a heating pad. ? Place a towel between your skin and the heat source. ? Leave the heat on for 20-30 minutes. ? Remove the heat if your skin turns bright red. This  is very important if you are unable to feel pain, heat, or cold. You may have a greater risk of getting burned.  Keep lights dim if bright lights bother you or make your headaches worse. Eating and drinking  Eat meals on a regular schedule.  If you drink alcohol: ? Limit how much you use to:  0-1 drink a day for women.  0-2 drinks a day for men. ? Be aware of how much alcohol is in your drink. In the [place] one drink equals one 12 oz bottle of beer (355 mL), one 5 oz glass of wine (148 mL), or one 1 oz glass of hard liquor (44 mL).  Stop drinking caffeine, or reduce how much caffeine you drink. General instructions   Keep a journal to find out if certain things bring on headaches. For example, write down: ? What you eat and drink. ? How much sleep you get. ? Any change to your diet or medicines.  Get a massage or try other ways to relax.  Limit stress.  Sit up straight. Do not tighten (tense) your muscles.  Do not use any products that contain nicotine or tobacco. This includes  cigarettes, e-cigarettes, and chewing tobacco. If you need help quitting, ask your doctor.  Exercise regularly as told by your doctor.  Get enough sleep. This often means 7-9 hours of sleep each night.  Keep all follow-up visits as told by your doctor. This is important. Contact a doctor if:  Your symptoms are not helped by medicine.  You have a headache that feels different than the other headaches.  You feel sick to your stomach (nauseous) or you throw up (vomit).  You have a fever. Get help right away if:  Your headache gets very bad quickly.  Your headache gets worse after a lot of physical activity.  You keep throwing up.  You have a stiff neck.  You have trouble seeing.  You have trouble speaking.  You have pain in the eye or ear.  Your muscles are weak or you lose muscle control.  You lose your balance or have trouble walking.  You feel like you will pass out (faint) or you pass out.  You are mixed up (confused).  You have a seizure. Summary  A headache is pain or discomfort that is felt around the head or neck area.  There are many causes and types of headaches. In some cases, the cause may not be found.  Keep a journal to help find out what causes your headaches. Watch your condition for any changes. Let your doctor know about them.  Contact a doctor if you have a headache that is different from usual, or if your headache is not helped by medicine.  Get help right away if your headache gets very bad, you throw up, you have trouble seeing, you lose your balance, or you have a seizure. This information is not intended to replace advice given to you by your health care provider. Make sure you discuss any questions you have with your health care provider. Document Revised: 06/24/2018 Document Reviewed: 06/24/2018 Elsevier Patient Education  2020 [employer].

## 2021-04-05 NOTE — Patient Instructions (Addendum)
Please continue using your CPAP regularly. While your insurance requires that you use CPAP at least 4 hours each night on 70% of the nights, I recommend, that you not skip any nights and use it throughout the night if you can. Getting used to CPAP and staying with the treatment long term does take time and patience and discipline. Untreated obstructive sleep apnea when it is moderate to severe can have an adverse impact on cardiovascular health and raise her risk for heart disease, arrhythmias, hypertension, congestive heart failure, stroke and diabetes. Untreated obstructive sleep apnea causes sleep disruption, nonrestorative sleep, and sleep deprivation. This can have an impact on your day to day functioning and cause daytime sleepiness and impairment of cognitive function, memory loss, mood disturbance, and problems focussing. Using CPAP regularly can improve these symptoms.   Follow up in 1 year   Sleep Apnea Sleep apnea affects breathing during sleep. It causes breathing to stop for a short time or to become shallow. It can also increase the risk of:  Heart attack.  Stroke.  Being very overweight (obese).  Diabetes.  Heart failure.  Irregular heartbeat. The goal of treatment is to help you breathe normally again. What are the causes? There are three kinds of sleep apnea:  Obstructive sleep apnea. This is caused by a blocked or collapsed airway.  Central sleep apnea. This happens when the brain does not send the right signals to the muscles that control breathing.  Mixed sleep apnea. This is a combination of obstructive and central sleep apnea. The most common cause of this condition is a collapsed or blocked airway. This can happen if:  Your throat muscles are too relaxed.  Your tongue and tonsils are too large.  You are overweight.  Your airway is too small.   What increases the risk?  Being overweight.  Smoking.  Having a small airway.  Being older.  Being  male.  Drinking alcohol.  Taking medicines to calm yourself (sedatives or tranquilizers).  Having family members with the condition. What are the signs or symptoms?  Trouble staying asleep.  Being sleepy or tired during the day.  Getting angry a lot.  Loud snoring.  Headaches in the morning.  Not being able to focus your mind (concentrate).  Forgetting things.  Less interest in sex.  Mood swings.  Personality changes.  Feelings of sadness (depression).  Waking up a lot during the night to pee (urinate).  Dry mouth.  Sore throat. How is this diagnosed?  Your medical history.  A physical exam.  A test that is done when you are sleeping (sleep study). The test is most often done in a sleep lab but may also be done at home. How is this treated?  Sleeping on your side.  Using a medicine to get rid of mucus in your nose (decongestant).  Avoiding the use of alcohol, medicines to help you relax, or certain pain medicines (narcotics).  Losing weight, if needed.  Changing your diet.  Not smoking.  Using a machine to open your airway while you sleep, such as: ? An oral appliance. This is a mouthpiece that shifts your lower jaw forward. ? A CPAP device. This device blows air through a mask when you breathe out (exhale). ? An EPAP device. This has valves that you put in each nostril. ? A BPAP device. This device blows air through a mask when you breathe in (inhale) and breathe out.  Having surgery if other treatments do not work. It   is important to get treatment for sleep apnea. Without treatment, it can lead to:  High blood pressure.  Coronary artery disease.  In men, not being able to have an erection (impotence).  Reduced thinking ability.   Follow these instructions at home: Lifestyle  Make changes that your doctor recommends.  Eat a healthy diet.  Lose weight if needed.  Avoid alcohol, medicines to help you relax, and some pain  medicines.  Do not use any products that contain nicotine or tobacco, such as cigarettes, e-cigarettes, and chewing tobacco. If you need help quitting, ask your doctor. General instructions  Take over-the-counter and prescription medicines only as told by your doctor.  If you were given a machine to use while you sleep, use it only as told by your doctor.  If you are having surgery, make sure to tell your doctor you have sleep apnea. You may need to bring your device with you.  Keep all follow-up visits as told by your doctor. This is important. Contact a doctor if:  The machine that you were given to use during sleep bothers you or does not seem to be working.  You do not get better.  You get worse. Get help right away if:  Your chest hurts.  You have trouble breathing in enough air.  You have an uncomfortable feeling in your back, arms, or stomach.  You have trouble talking.  One side of your body feels weak.  A part of your face is hanging down. These symptoms may be an emergency. Do not wait to see if the symptoms will go away. Get medical help right away. Call your local emergency services (911 in the [place] Do not drive yourself to the hospital. Summary  This condition affects breathing during sleep.  The most common cause is a collapsed or blocked airway.  The goal of treatment is to help you breathe normally while you sleep. This information is not intended to replace advice given to you by your health care provider. Make sure you discuss any questions you have with your health care provider. Document Revised: 09/20/2018 Document Reviewed: 07/30/2018 Elsevier Patient Education  2021 [employer]  

## 2021-04-05 NOTE — Progress Notes (Addendum)
Chief Complaint  Patient presents with  . Obstructive Sleep Apnea    RM 2 alone Pt is well, CPAP helps him sleep great.      HISTORY OF PRESENT ILLNESS: 04/06/21 ALL: Ryan Kramer returns for follow up on headaches and OSA on CPAP. He has resumed CPAP therapy. He is doing well on therapy. He feels that he is sleeping better. Headaches have resolved. He denies concerns with CPAP or supplies.      10/06/2020 ALL:  Ryan Kramer is a 30 y.o. male here today for follow up for headaches. He has discontinued amitriptyline. He did not feel it was needed. He has not had any recent headaches. He does note some tension if he doesn't drink water. EEG was normal. No seizure like activity. He has not used CPAP in at least 2 months. He has felt well until the past week. He traveled to [place] and when he got back he had some neck tightness and his ears are popping. He feels this is related to traveling. No fevers or pain with movement of neck. No other signs of infection. He is feeling well and without complaints. He would ike to return to work and needs neurological clearance. He is followed by PCP regularly.    HISTORY (copied from my note on 04/13/2020)  Ryan Kramer is a 30 y.o. male here today for follow up for headaches. He did start amitriptyline. He titrated dose to $Remov'50mg'sZxGhU$  at bedtime. He feels that headaches are better. He does continue to have intermittent headaches (1-2 weekly), neck stiffness and right eye weakness. Eye weakness is described as a "tired feeling" with blurry vision in the evenings. No drooping of eye lid. No vision loss. He feels off balance at times. He endorses intermittent, random, generalized numbness and tingling. No obvious seizure activity. MRI was normal. Mom is asking to proceed with EEG. He tried stopping all medications to see how he felt but states that after about a week, symptoms worsened. When he resumed meds, symptoms improved. He has not seen ophthalmology. He  reports having appt next week. BP is elevated today (141/103). His mom usually checks BP at home but he is unclear about readings. He thinks that last week it was 133/87. He reports using CPAP every night. He denies any concerns with CPAP machine or supplies. Compliance report dated 03/13/2020-04/11/2020 reveals that he has used CPAP 2 of the past 30 days. There was a significant leak in the 95th percentile of 41.4L/min.   HISTORY: (copied from Dr Rexene Alberts note on 03/10/2020)  Ryan Kramer is a 30 year old right-handed gentleman with an underlying benign medical history other than morbid obesity with a BMI of over 71, who presents for follow-up consultation of his obstructive sleep apnea and also recent emergency room visit for increase in headaches and diagnosis of viral meningitison 02/22/20.He is accompanied by his mother today. He missed an appointment for sleep apnea follow-up in January 2021. He missed an appointment on 02/26/20. I first met him on 10/06/2019 at the request of his primary care physician, at which time the patient reported intermittent headaches, snoring and daytime somnolence. He was advised to proceed with a sleep study. He had a baseline sleep study on 11/01/2019 and I reviewed the results: mild obstructive sleep apnea, moderate during supine sleep, severe in REM sleep with a total AHI of 13.8/hour, REM AHI of 36.7/hour, supine AHI of 17.9/hour, and O2 nadir of 83%. We called him with his test results and he  was advised to start AutoPap therapy due to his history of headaches and daytime somnolence reported.  He recently presented to the emergency room on 02/21/2020 with a 2-week history of headaches and he started having numbness in his extremities. He was treated symptomatically with a migraine cocktail. He had multiple imaging tests. He had a head CT without contrast on 02/21/2020 and I reviewed the results: IMPRESSION: No acute intracranial findings.  Symmetric prominence of the  bilateral parotid glands, a nonspecific finding. 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 This is a typically reversible entity with a wide differential including medications (primarily anti-seizure), infection (no additional imaging evidence of this), metabolic disease, and some associations such as vaccination. A repeat study in 2-4 weeks could be considered. Normal vascular imaging.  He had a neurology consultation in the emergency room on 02/22/2020 and reported neck stiffness. He had a lumbar puncture which showed an opening pressure of 32 cm, elevated protein and elevated white cell count with lymphocytic predominance.  He was diagnosed with viral meningitis.  He presented to the emergency room on 02/23/2020 with ongoing headaches. He was treated with IV hydration, Decadron, fentanyl and droperidol.  He had a telemedicine follow-up appointment with his primary care physician on 02/24/2020 and reported feeling improved.  CSF culture was negative. HSV PCR for HSV 1 and 2 was negative in the CSF.  Today, 03/10/2020: I reviewed his AutoPap compliance data from 02/03/20 through 03/03/20, which is a total of 30 days, during which time he used his autoPAP 2 days.  He reports a generalized headache. His mother provides most of his history. She reports that he has had nausea, he is in bed most of the day. She reports that he has taken Tylenol and Motrin with little relief. He reportsthat he stopped using his AutoPap. He did not have any problems tolerating the pressure of the mask. He indicates that he has a full facemask. He would be willing to restart his AutoPap. His mother would like for him to have something for pain. I explained  to her that at the most we can provide a Toradol injection in the office but she also reported that he had been taking 600 to 800 mg of ibuprofen and 2 extra strength Tylenol alternating, which is why I did not feel comfortable adding yet another nonsteroidal anti-inflammatory medication by injection in the office today. She did request a prescription for his nausea medicine, he had been given a prescription for Zofran which I reviewed. He denies any one-sided weakness or numbness or tingling. She is requesting a brain MRI with contrast. I explained to her that I would be happy to order a repeat brain MRI with and without contrast but in the outpatient setting it is always slower even with an urgent request than in the inpatient setting or ER setting. He drinks about 5 or 6 cups of water per day, he does not drink any sodas or caffeine currently. He is not sleeping very well. His mother reports that he has not slept well in the past 3 to 4 weeks.  Previously:   10/06/2019: (He) reports snoring and excessive daytime somnolence, intermittent headaches. I reviewed your office note from 09/24/2019. He was recently tested for COVID-19 and tested  negative. His blood pressure is elevated today but was noted to be normal at his visit with you. He reports difficulty initiating and maintaining sleep for the past 2 years. His snoring has been noted by his uncle and his mother. He lives with his mother, paternal uncle has sleep apnea and uses a CPAP machine. Patient reports that he has been told he has stops in his breathing. He also endorses restless leg symptoms and kicking in his sleep. He has a TV in the bedroom and does not always leave it on at night but sometimes it stays on. He has no pets in the household. He estimates that he gets about 4 to 5 hours of sleep on any given night. He has tried melatonin which did not help. He does not currently drink any caffeine. He has reduced his alcohol  intake and reports that he was drinking quite heavily. He is now drinking approximately half a bottle of liquor once or twice a week. He used to drink daily. He smokes occasional cigars, no cigarettes. He is trying to lose weight and has lost some weight in the past 2 months but in the past year, he had gained about 80 pounds. Bedtime is generally between 10 and 10:30 PM and rise time is between 6 and 6:30 AM. He has had rare morning headaches, he has nocturia about 2-4 times per average night. Epworth sleepiness score is 9 out of 24, fatigue severity score is 43 out of 63.    REVIEW OF SYSTEMS: Out of a complete 14 system review of symptoms, the patient complains only of the following symptoms, fatigue and all other reviewed systems are negative.  ESS: 4   ALLERGIES: No Known Allergies   HOME MEDICATIONS: No outpatient medications prior to visit.   No facility-administered medications prior to visit.     PAST MEDICAL HISTORY: History reviewed. No pertinent past medical history.   PAST SURGICAL HISTORY: Past Surgical History:  Procedure Laterality Date  . SHOULDER SURGERY       FAMILY HISTORY: Family History  Problem Relation Age of Onset  . Hypertension Mother   . Hypertension Father      SOCIAL HISTORY: Social History   Socioeconomic History  . Marital status: Single    Spouse name: Not on file  . Number of children: Not on file  . Years of education: Not on file  . Highest education level: Not on file  Occupational History  . Not on file  Tobacco Use  . Smoking status: Current Every Day Smoker    Types: Cigars  . Smokeless tobacco: Never Used  . Tobacco comment: once a week  Substance and Sexual Activity  . Alcohol use: Yes    Comment: 2-3 drinks a week  . Drug use: No  . Sexual activity: Not on file  Other Topics Concern  . Not on file  Social History Narrative  . Not on file   Social Determinants of Health   Financial Resource Strain: Not  on file  Food Insecurity: Not on file  Transportation Needs: Not on file  Physical Activity: Not on file  Stress: Not on file  Social Connections: Not on file  Intimate Partner Violence: Not on file      PHYSICAL EXAM  Vitals:   04/06/21 1103  BP: (!) 152/95  Pulse: 85  Weight: (!) 361 lb (163.7 kg)  Height: 6' (1.829 m)   Body mass index is 48.96 kg/m.   Generalized: Well developed, in  no acute distress   Neurological examination  Mentation: Alert oriented to time, place, history taking. Follows all commands speech and language fluent Cranial nerve II-XII: Pupils were equal round reactive to light. Extraocular movements were full, visual field were full on confrontational test. Facial sensation and strength were normal. Head turning and shoulder shrug  were normal and symmetric. Motor: The motor testing reveals 5 over 5 strength of all 4 extremities. Good symmetric motor tone is noted throughout.  Sensory: Sensory testing is intact to soft touch on all 4 extremities. No evidence of extinction is noted.  Coordination: Cerebellar testing reveals good finger-nose-finger and heel-to-shin bilaterally.  Gait and station: Gait is normal.    DIAGNOSTIC DATA (LABS, IMAGING, TESTING) - I reviewed patient records, labs, notes, testing and imaging myself where available.  Lab Results  Component Value Date   WBC 13.9 (H) 02/21/2020   HGB 16.3 02/21/2020   HCT 49.7 02/21/2020   MCV 94.0 02/21/2020   PLT 227 02/21/2020      Component Value Date/Time   NA 137 03/10/2020 0925   K 4.9 03/10/2020 0925   CL 100 03/10/2020 0925   CO2 21 03/10/2020 0925   GLUCOSE 127 (H) 03/10/2020 0925   GLUCOSE 126 (H) 02/22/2020 1025   BUN 11 03/10/2020 0925   CREATININE 1.15 03/10/2020 0925   CALCIUM 9.9 03/10/2020 0925   PROT 7.5 03/10/2020 0925   ALBUMIN 4.4 03/10/2020 0925   AST 13 03/10/2020 0925   ALT 31 03/10/2020 0925   ALKPHOS 102 03/10/2020 0925   BILITOT 0.6 03/10/2020 0925    GFRNONAA 85 03/10/2020 0925   GFRAA 98 03/10/2020 0925   Lab Results  Component Value Date   CHOL 150 09/24/2019   HDL 33 (L) 09/24/2019   LDLCALC 90 09/24/2019   TRIG 153 (H) 09/24/2019   CHOLHDL 4.5 09/24/2019   Lab Results  Component Value Date   HGBA1C 5.8 (H) 09/24/2019   No results found for: [id number] Lab Results  Component Value Date   TSH 0.855 09/24/2019      ASSESSMENT AND PLAN  30 y.o. year old male  has no past medical history on file. here with   [facility] on CPAP - Plan: For home use only DME continuous positive airway pressure (CPAP)  Recurrent headache  Ryan Kramer is doing well, today. Compliance report shows optimal daily and 4 hour compliance. He was encouraged to continue CPAP nightly and greater than 4 hours each night. Supply orders updated. Headaches are resolved. He will continue close follow up with PCP. Healthy lifestyle habits encouraged. He will follow up with me in 1 year. He verbalizes understanding and agreement with this plan.    I spent 20 minutes of face-to-face and non-face-to-face time with patient.  This included previsit chart review, lab review, study review, order entry, electronic health record documentation, patient education.    Debbora Presto, MSN, FNP-C 04/06/2021, 11:21 AM  [facility] [address], [place], [place] [address] [phone]  I reviewed the above note and documentation by the Nurse Practitioner and agree with the history, exam, assessment and plan as outlined above. I was available for consultation. Star Age, MD, PhD [facility])

## 2021-04-06 ENCOUNTER — Ambulatory Visit: Payer: Self-pay | Admitting: Family Medicine

## 2021-04-06 ENCOUNTER — Encounter: Payer: Self-pay | Admitting: Family Medicine

## 2021-04-06 VITALS — BP 152/95 | HR 85 | Ht 72.0 in | Wt 361.0 lb

## 2021-04-06 DIAGNOSIS — G4733 Obstructive sleep apnea (adult) (pediatric): Secondary | ICD-10-CM

## 2021-04-06 DIAGNOSIS — R519 Headache, unspecified: Secondary | ICD-10-CM

## 2021-04-06 DIAGNOSIS — Z9989 Dependence on other enabling machines and devices: Secondary | ICD-10-CM

## 2021-06-01 IMAGING — MR MR HEAD W/O CM
12 of 13 series · 43 of 48 positions shown · IV contrast (agent unspecified)
Comparison: None.
COMPARISON: None.

Addendum:
CLINICAL DATA: New daily headache

EXAM:
MRI HEAD WITHOUT CONTRAST
MRA HEAD WITHOUT CONTRAST
MRA NECK WITHOUT AND WITH CONTRAST
MRV HEAD WITHOUT AND WITH CONTRAST
TECHNIQUE: Multiplanar, multiecho pulse sequences of the brain and surrounding
structures were obtained without intravenous contrast. Angiographic
images of the intracranial venous structures were obtained using MRV
technique without and with intravenous contrast. Angiographic images
of the Circle of Willis were obtained using MRA technique without
intravenous contrast. Angiographic images of the neck were obtained
using MRA technique without and with intravenous contrast. Carotid
stenosis measurements (when applicable) are obtained utilizing
NASCET criteria, using the distal internal carotid diameter as the
denominator.

[Series 5: DWI · axial · 3.0mm · 0.88mm/px · z∈[-106,+25]mm · 6 of 90 slices shown (1 of 4)]
[im 1/90]
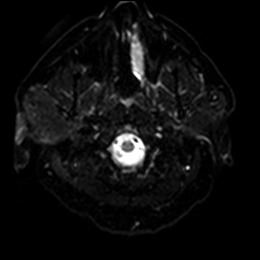
[im 18/90]
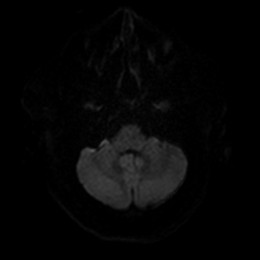
[im 36/90]
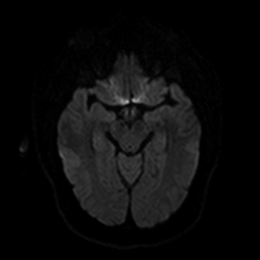
[im 54/90]
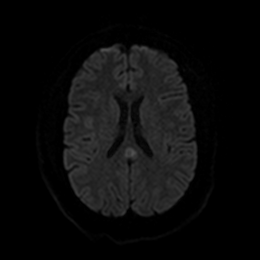
[im 72/90]
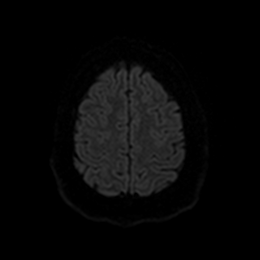
[im 90/90]
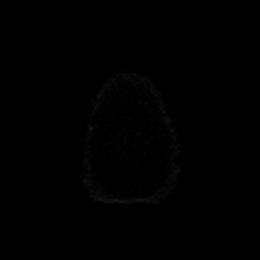

[Series 6: DWI · axial · 3.0mm · 0.88mm/px · z∈[-106,+25]mm · 2 of 45 slices shown (2 of 4)]
[im 1/45]
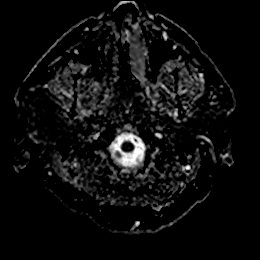
[im 45/45]
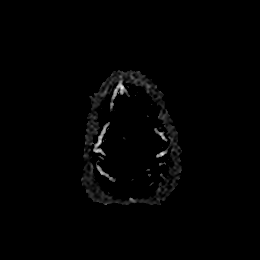

[Series 7: DWI · coronal · 4.0mm · 0.88mm/px · 5 of 72 slices shown (3 of 4)]
[im 1/72]
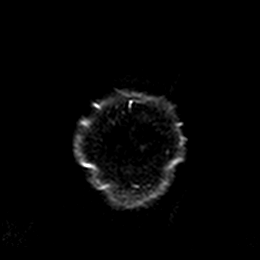
[im 18/72]
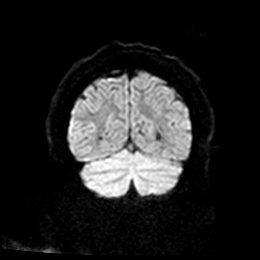
[im 36/72]
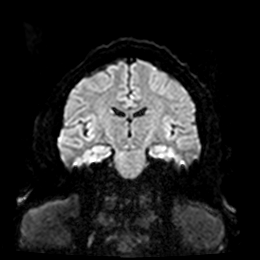
[im 54/72]
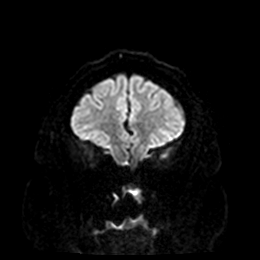
[im 72/72]
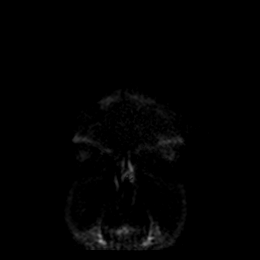

[Series 8: DWI · coronal · 4.0mm · 0.88mm/px · 3 of 36 slices shown (4 of 4)]
[im 1/36]
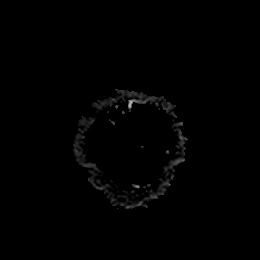
[im 18/36]
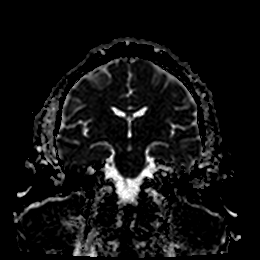
[im 36/36]
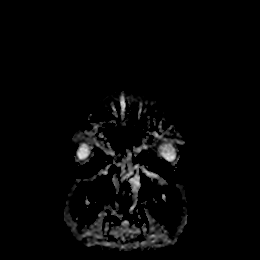

[Series 9: T1 · sagittal · 5.0mm · 0.75mm/px · 2 of 23 slices shown]
[im 1/23]
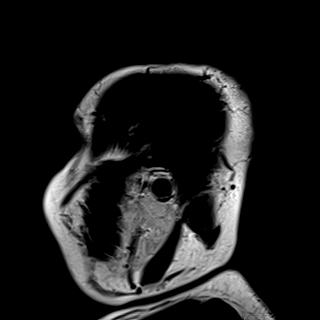
[im 23/23]
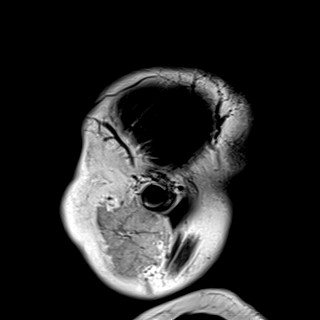

[Series 10: T2 · axial · 5.0mm · 0.72mm/px · z∈[-112,+31]mm · 2 of 25 slices shown (1 of 2)]
[im 1/25]
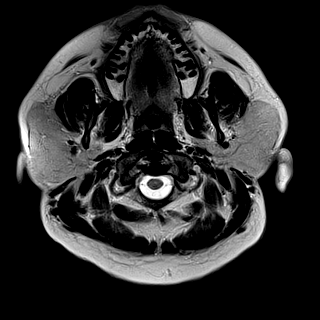
[im 25/25]
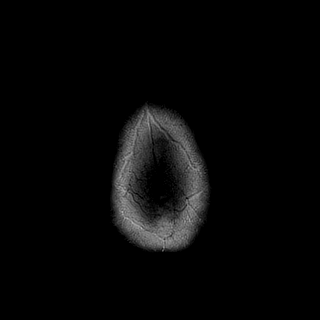

[Series 11: FLAIR · axial · 5.0mm · 0.45mm/px · z∈[-110,+33]mm · 2 of 25 slices shown]
[im 1/25]
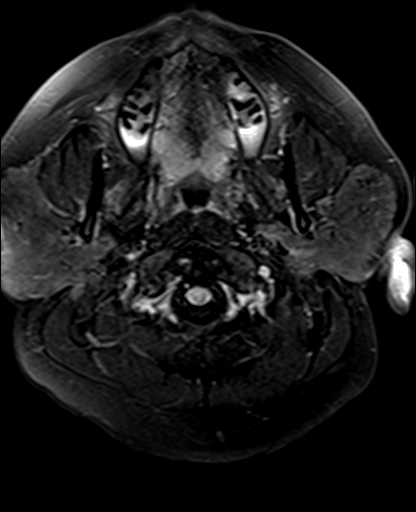
[im 25/25]
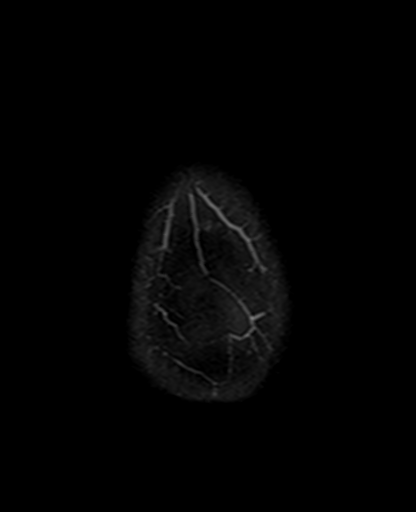

[Series 12: mag_images · axial · 3.0mm · 0.90mm/px · z∈[-126,+49]mm · 5 of 60 slices shown]
[im 1/60]
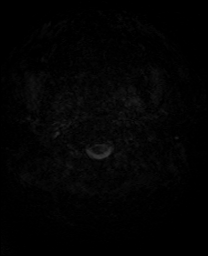
[im 15/60]
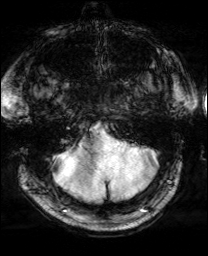
[im 30/60]
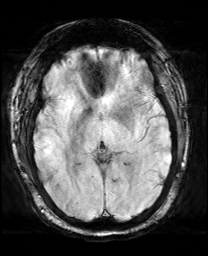
[im 45/60]
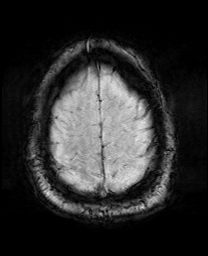
[im 60/60]
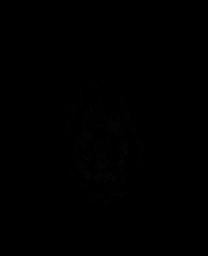

[Series 13: pha_images · axial · 3.0mm · 0.90mm/px · z∈[-126,+49]mm · 5 of 60 slices shown]
[im 1/60]
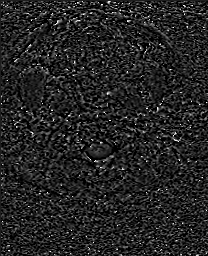
[im 15/60]
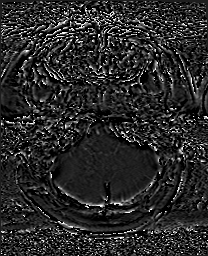
[im 30/60]
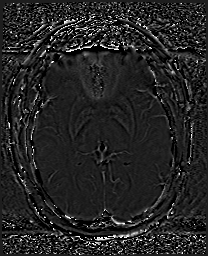
[im 45/60]
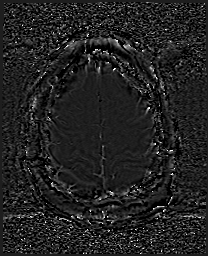
[im 60/60]
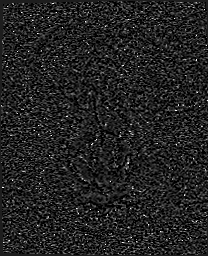

[Series 14: swi_images · axial · 3.0mm · 0.90mm/px · z∈[-126,+49]mm · 5 of 60 slices shown]
[im 1/60]
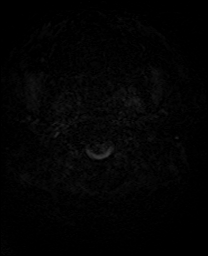
[im 15/60]
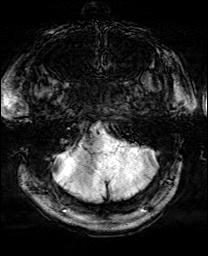
[im 30/60]
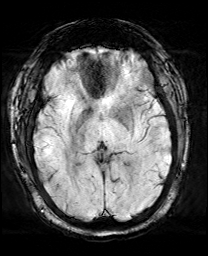
[im 45/60]
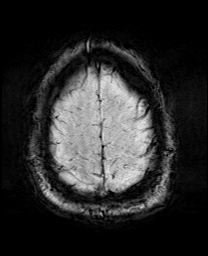
[im 60/60]
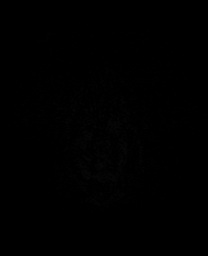

[Series 15: mip_images(sw) · axial · 24.0mm · 0.90mm/px · z∈[-116,+39]mm · 4 of 53 slices shown]
[im 1/53]
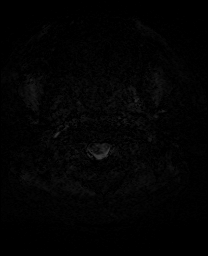
[im 18/53]
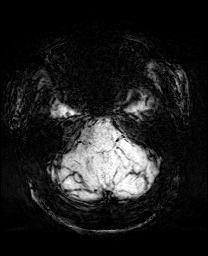
[im 35/53]
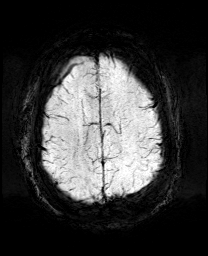
[im 53/53]
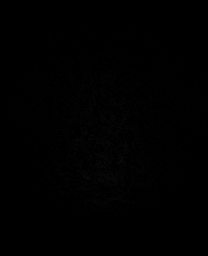

[Series 17: T2 · coronal · 5.0mm · 0.34mm/px · 2 of 29 slices shown (2 of 2)]
[im 1/29]
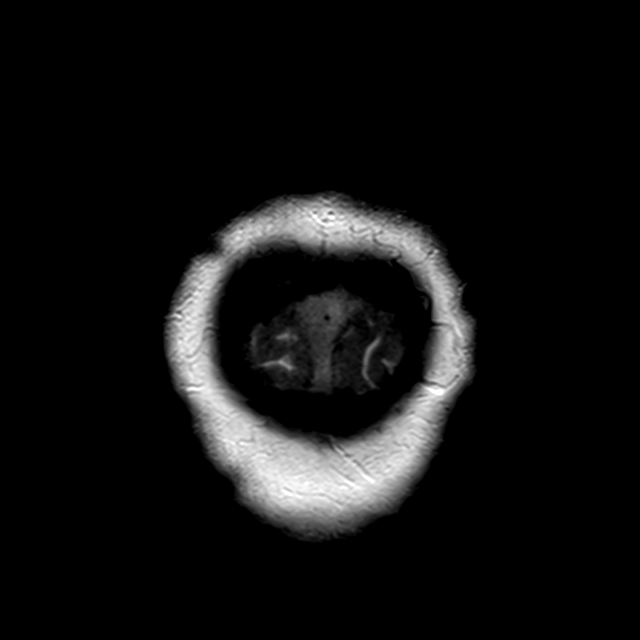
[im 29/29]
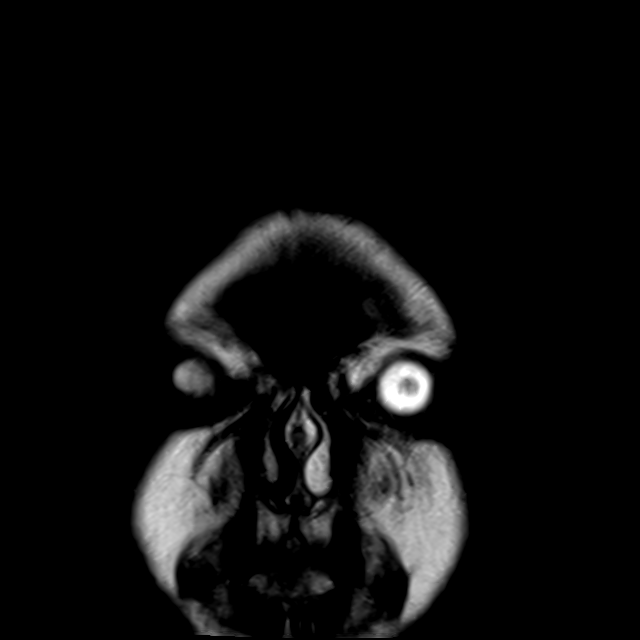

[43 of 48 positions shown; findings below may reference images not displayed]

FINDINGS: MRI HEAD

Brain: There is a 13 mm area of restricted diffusion involving the
midline splenium of the corpus callosum. There is no intracranial
hemorrhage. There is no intracranial mass, mass effect, or edema.
There is no hydrocephalus or extra-axial fluid collection. No
abnormal enhancement.

Vascular: Major vessel flow voids at the skull base are preserved.

Skull and upper cervical spine: Normal marrow signal is preserved.

Sinuses/Orbits: Trace mucosal thickening.  Orbits are unremarkable.

Other: Sella is unremarkable.  Mastoid air cells are clear.

MRA HEAD

Intracranial internal carotid arteries are patent. Middle and
anterior cerebral arteries are patent. Intracranial vertebral
arteries, basilar artery, posterior cerebral arteries are patent.
There is no significant stenosis or aneurysm.

MRV HEAD

Superior sagittal sinus, straight sinus, vein of Galadima, internal
cerebral veins, and post transverse and sigmoid sinuses are patent.
Right transverse sinus is dominant. No evidence of venous
thrombosis.

MRA NECK

Common, internal, and external carotid arteries are patent.
Extracranial vertebral arteries are patent. There is no
hemodynamically significant stenosis.
IMPRESSION: Small area of abnormal signal within the midline splenium of the
corpus callosum. This is a typically reversible entity with a wide
differential including medications (primarily anti-seizure),
infection (no additional imaging evidence of this), metabolic
disease, and some associations such as vaccination. A repeat study
in 2-4 weeks could be considered.

Normal vascular imaging.

ADDENDUM:
10 mL Gadavist was administered intravenously

*** End of Addendum ***
FINDINGS: MRI HEAD

Brain: There is a 13 mm area of restricted diffusion involving the
midline splenium of the corpus callosum. There is no intracranial
hemorrhage. There is no intracranial mass, mass effect, or edema.
There is no hydrocephalus or extra-axial fluid collection. No
abnormal enhancement.

Vascular: Major vessel flow voids at the skull base are preserved.

Skull and upper cervical spine: Normal marrow signal is preserved.

Sinuses/Orbits: Trace mucosal thickening.  Orbits are unremarkable.

Other: Sella is unremarkable.  Mastoid air cells are clear.

MRA HEAD

Intracranial internal carotid arteries are patent. Middle and
anterior cerebral arteries are patent. Intracranial vertebral
arteries, basilar artery, posterior cerebral arteries are patent.
There is no significant stenosis or aneurysm.

MRV HEAD

Superior sagittal sinus, straight sinus, vein of Galadima, internal
cerebral veins, and post transverse and sigmoid sinuses are patent.
Right transverse sinus is dominant. No evidence of venous
thrombosis.

MRA NECK

Common, internal, and external carotid arteries are patent.
Extracranial vertebral arteries are patent. There is no
hemodynamically significant stenosis.
IMPRESSION: Small area of abnormal signal within the midline splenium of the
corpus callosum. This is a typically reversible entity with a wide
differential including medications (primarily anti-seizure),
infection (no additional imaging evidence of this), metabolic
disease, and some associations such as vaccination. A repeat study
in 2-4 weeks could be considered.

Normal vascular imaging.

## 2022-04-06 ENCOUNTER — Ambulatory Visit: Payer: Self-pay | Admitting: Family Medicine

## 2022-04-17 ENCOUNTER — Encounter: Payer: Self-pay | Admitting: Family Medicine

## 2022-04-17 ENCOUNTER — Ambulatory Visit (INDEPENDENT_AMBULATORY_CARE_PROVIDER_SITE_OTHER): Payer: Self-pay | Admitting: Family Medicine

## 2022-04-17 VITALS — BP 155/108 | HR 81 | Ht 72.0 in | Wt 378.0 lb

## 2022-04-17 DIAGNOSIS — G4733 Obstructive sleep apnea (adult) (pediatric): Secondary | ICD-10-CM

## 2022-04-17 DIAGNOSIS — Z9989 Dependence on other enabling machines and devices: Secondary | ICD-10-CM

## 2022-04-17 NOTE — Progress Notes (Signed)
? ? ?Chief Complaint  ?Patient presents with  ? Follow-up  ?  Pt alone, rm 2. Overall stable. States CPAP working well. DME [facility]  ? ? ?HISTORY OF PRESENT ILLNESS: ? ?04/17/22 ALL: ?Ryan Kramer returns for follow up for OSA on CPAP. He continues to do well. He is using CPAP nightly for about 8 hours. He denies concerns with machine or supplies. He is unsure of BP readings at home. He is due for CPE with PCP. He is asymptomatic, today.  ? ? ? ?04/06/2021 ALL: ?Ryan Kramer returns for follow up on headaches and OSA on CPAP. He has resumed CPAP therapy. He is doing well on therapy. He feels that he is sleeping better. Headaches have resolved. He denies concerns with CPAP or supplies.  ? ? ? ? ?10/06/2020 ALL:  ?Ryan Kramer is a 31 y.o. male here today for follow up for headaches. He has discontinued amitriptyline. He did not feel it was needed. He has not had any recent headaches. He does note some tension if he doesn't drink water. EEG was normal. No seizure like activity. He has not used CPAP in at least 2 months. He has felt well until the past week. He traveled to [place] and when he got back he had some neck tightness and his ears are popping. He feels this is related to traveling. No fevers or pain with movement of neck. No other signs of infection. He is feeling well and without complaints. He would ike to return to work and needs neurological clearance. He is followed by PCP regularly.  ? ? ?HISTORY (copied from my note on 04/13/2020) ? ?Ryan Kramer is a 31 y.o. male here today for follow up for headaches. He did start amitriptyline. He titrated dose to $Remov'50mg'frpLYo$  at bedtime. He feels that headaches are better. He does continue to have intermittent headaches (1-2 weekly), neck stiffness and right eye weakness. Eye weakness is described as a "tired feeling" with blurry vision in the evenings. No drooping of eye lid. No vision loss. He feels off balance at times. He endorses intermittent, random, generalized  numbness and tingling. No obvious seizure activity. MRI was normal. Mom is asking to proceed with EEG. He tried stopping all medications to see how he felt but states that after about a week, symptoms worsened. When he resumed meds, symptoms improved. He has not seen ophthalmology. He reports having appt next week. BP is elevated today (141/103). His mom usually checks BP at home but he is unclear about readings. He thinks that last week it was 133/87. He reports using CPAP every night. He denies any concerns with CPAP machine or supplies. Compliance report dated 03/13/2020-04/11/2020 reveals that he has used CPAP 2 of the past 30 days. There was a significant leak in the 95th percentile of 41.4L/min.  ?  ?HISTORY: (copied from Dr Rexene Alberts note on 03/10/2020) ?  ?Ryan Kramer is a 31 year old right-handed gentleman with an underlying benign medical history other than morbid obesity with a BMI of over 83, who presents for follow-up consultation of his obstructive sleep apnea and also recent emergency room visit for increase in headaches and diagnosis of viral meningitis on 02/22/20.  He is accompanied by his mother today.  He missed an appointment for sleep apnea follow-up in January 2021. He missed an appointment on 02/26/20.  I first met him on 10/06/2019 at the request of his primary care physician, at which time the patient reported intermittent headaches, snoring and daytime somnolence.  He was advised to proceed with a sleep study.  He had a baseline sleep study on 11/01/2019 and I reviewed the results: mild obstructive sleep apnea, moderate during supine sleep, severe in REM sleep with a total AHI of 13.8/hour, REM AHI of 36.7/hour, supine AHI of 17.9/hour, and O2 nadir of 83%. ?We called him with his test results and he was advised to start AutoPap therapy due to his history of headaches and daytime somnolence reported. ?  ?He recently presented to the emergency room on 02/21/2020 with a 2-week history of headaches and he  started having numbness in his extremities.  He was treated symptomatically with a migraine cocktail.  He had multiple imaging tests.  He had a head CT without contrast on 02/21/2020 and I reviewed the results: IMPRESSION: ?No acute intracranial findings. ?  ?Symmetric prominence of the bilateral parotid glands, a nonspecific ?finding. ?He then had a MRI brain without contrast, MRA head without contrast, MR venogram with and without contrast of the head and MRA neck with and without contrast on 02/22/2020 and I reviewed the results: ?  ?IMPRESSION: ?Small area of abnormal signal within the midline splenium of the ?corpus callosum. This is a typically reversible entity with a wide ?differential including medications (primarily anti-seizure), ?infection (no additional imaging evidence of this), metabolic ?disease, and some associations such as vaccination. A repeat study ?in 2-4 weeks could be considered.  ?Normal vascular imaging. ?  ?He had a neurology consultation in the emergency room on 02/22/2020 and reported neck stiffness.  He had a lumbar puncture which showed an opening pressure of 32 cm, elevated protein and elevated white cell count with lymphocytic predominance.   ?He was diagnosed with viral meningitis. ?  ?He presented to the emergency room on 02/23/2020 with ongoing headaches.  He was treated with IV hydration, Decadron, fentanyl and droperidol. ?  ?He had a telemedicine follow-up appointment with his primary care physician on 02/24/2020 and reported feeling improved. ?  ?CSF culture was negative.  HSV PCR for HSV 1 and 2 was negative in the CSF. ?  ?Today, 03/10/2020: I reviewed his AutoPap compliance data from 02/03/20 through 03/03/20, which is a total of 30 days, during which time he used his autoPAP 2 days.  ?  ?He reports a generalized headache.  His mother provides most of his history.  She reports that he has had nausea, he is in bed most of the day.  She reports that he has taken Tylenol and Motrin with  little relief.  He reports that he stopped using his AutoPap.  He did not have any problems tolerating the pressure of the mask.  He indicates that he has a full facemask.  He would be willing to restart his AutoPap.  His mother would like for him to have something for pain.  I explained to her that at the most we can provide a Toradol injection in the office but she also reported that he had been taking 600 to 800 mg of ibuprofen and 2 extra strength Tylenol alternating, which is why I did not feel comfortable adding yet another nonsteroidal anti-inflammatory medication by injection in the office today.  She did request a prescription for his nausea medicine, he had been given a prescription for Zofran which I reviewed. ?He denies any one-sided weakness or numbness or tingling. ?She is requesting a brain MRI with contrast.  I explained to her that I would be happy to order a repeat brain MRI with and without  contrast but in the outpatient setting it is always slower even with an urgent request than in the inpatient setting or ER setting.  He drinks about 5 or 6 cups of water per day, he does not drink any sodas or caffeine currently.  He is not sleeping very well.  His mother reports that he has not slept well in the past 3 to 4 weeks. ?  ?Previously:  ?  ?10/06/2019: (He) reports snoring and excessive daytime somnolence, intermittent headaches.  I reviewed your office note from 09/24/2019.  He was recently tested for COVID-19 and tested negative.  His blood pressure is elevated today but was noted to be normal at his visit with you.  He reports difficulty initiating and maintaining sleep for the past 2 years.  His snoring has been noted by his uncle and his mother.  He lives with his mother, paternal uncle has sleep apnea and uses a CPAP machine.  Patient reports that he has been told he has stops in his breathing.  He also endorses restless leg symptoms and kicking in his sleep.  He has a TV in the bedroom and  does not always leave it on at night but sometimes it stays on.  He has no pets in the household.  He estimates that he gets about 4 to 5 hours of sleep on any given night.  He has tried melatonin which did

## 2022-04-17 NOTE — Patient Instructions (Addendum)
Please continue using your CPAP regularly. While your insurance requires that you use CPAP at least 4 hours each night on 70% of the nights, I recommend, that you not skip any nights and use it throughout the night if you can. Getting used to CPAP and staying with the treatment long term does take time and patience and discipline. Untreated obstructive sleep apnea when it is moderate to severe can have an adverse impact on cardiovascular health and raise her risk for heart disease, arrhythmias, hypertension, congestive heart failure, stroke and diabetes. Untreated obstructive sleep apnea causes sleep disruption, nonrestorative sleep, and sleep deprivation. This can have an impact on your day to day functioning and cause daytime sleepiness and impairment of cognitive function, memory loss, mood disturbance, and problems focussing. Using CPAP regularly can improve these symptoms. ? ?Dr Alvy Bimler call to schedule appt for BP review. [phone] ? ?Follow up in 1 year  ?

## 2022-04-18 NOTE — Progress Notes (Signed)
CM sent to [facility] for new order ?

## 2022-04-20 ENCOUNTER — Ambulatory Visit (INDEPENDENT_AMBULATORY_CARE_PROVIDER_SITE_OTHER): Payer: Self-pay | Admitting: Emergency Medicine

## 2022-04-20 ENCOUNTER — Encounter: Payer: Self-pay | Admitting: Emergency Medicine

## 2022-04-20 VITALS — BP 156/110 | HR 83 | Temp 98.2°F | Ht 72.0 in | Wt 382.0 lb

## 2022-04-20 DIAGNOSIS — Z7689 Persons encountering health services in other specified circumstances: Secondary | ICD-10-CM

## 2022-04-20 DIAGNOSIS — I10 Essential (primary) hypertension: Secondary | ICD-10-CM | POA: Insufficient documentation

## 2022-04-20 LAB — CBC WITH DIFFERENTIAL/PLATELET
Basophils Absolute: 0 10*3/uL (ref 0.0–0.1)
Basophils Relative: 0.4 % (ref 0.0–3.0)
Eosinophils Absolute: 0.2 10*3/uL (ref 0.0–0.7)
Eosinophils Relative: 1.3 % (ref 0.0–5.0)
HCT: 44.8 % (ref 39.0–52.0)
Hemoglobin: 14.5 g/dL (ref 13.0–17.0)
Lymphocytes Relative: 21.8 % (ref 12.0–46.0)
Lymphs Abs: 2.9 10*3/uL (ref 0.7–4.0)
MCHC: 32.3 g/dL (ref 30.0–36.0)
MCV: 92.3 fl (ref 78.0–100.0)
Monocytes Absolute: 1.2 10*3/uL — ABNORMAL HIGH (ref 0.1–1.0)
Monocytes Relative: 8.9 % (ref 3.0–12.0)
Neutro Abs: 8.9 10*3/uL — ABNORMAL HIGH (ref 1.4–7.7)
Neutrophils Relative %: 67.6 % (ref 43.0–77.0)
Platelets: 236 10*3/uL (ref 150.0–400.0)
RBC: 4.86 Mil/uL (ref 4.22–5.81)
RDW: 14.3 % (ref 11.5–15.5)
WBC: 13.2 10*3/uL — ABNORMAL HIGH (ref 4.0–10.5)

## 2022-04-20 LAB — LIPID PANEL
Cholesterol: 144 mg/dL (ref 0–200)
HDL: 39.7 mg/dL (ref >39.00)
LDL Cholesterol: 90 mg/dL (ref 0–99)
NonHDL: 104.51
Total CHOL/HDL Ratio: 4
Triglycerides: 75 mg/dL (ref 0.0–149.0)
VLDL: 15 mg/dL (ref 0.0–40.0)

## 2022-04-20 LAB — COMPREHENSIVE METABOLIC PANEL
ALT: 26 U/L (ref 0–53)
AST: 16 U/L (ref 0–37)
Albumin: 4 g/dL (ref 3.5–5.2)
Alkaline Phosphatase: 83 U/L (ref 39–117)
BUN: 13 mg/dL (ref 6–23)
CO2: 26 mEq/L (ref 19–32)
Calcium: 9 mg/dL (ref 8.4–10.5)
Chloride: 104 mEq/L (ref 96–112)
Creatinine, Ser: 1.14 mg/dL (ref 0.40–1.50)
GFR: 85 mL/min (ref >60.00)
Glucose, Bld: 101 mg/dL — ABNORMAL HIGH (ref 70–99)
Potassium: 4.2 mEq/L (ref 3.5–5.1)
Sodium: 137 mEq/L (ref 135–145)
Total Bilirubin: 0.6 mg/dL (ref 0.2–1.2)
Total Protein: 7.4 g/dL (ref 6.0–8.3)

## 2022-04-20 LAB — HEMOGLOBIN A1C: Hgb A1c MFr Bld: 5.5 % (ref 4.6–6.5)

## 2022-04-20 MED ORDER — VALSARTAN-HYDROCHLOROTHIAZIDE 160-12.5 MG PO TABS: 1.0000 | ORAL_TABLET | Freq: Every day | ORAL | 3 refills | Status: DC

## 2022-04-20 NOTE — Assessment & Plan Note (Signed)
Diet and nutrition discussed.  Advised to reduce the amount of daily carbohydrate intake and decrease number of daily calories ingested. ?We will refer to medical weight management center. ?

## 2022-04-20 NOTE — Assessment & Plan Note (Signed)
Cardiovascular risks associated with uncontrolled hypertension discussed. ?BP Readings from Last 3 Encounters:  ?04/20/22 (!) 156/110  ?04/17/22 (!) 155/108  ?04/06/21 (!) 152/95  ?Dietary approaches to stop hypertension discussed ?We will start valsartan-HCTZ 160-12.5 mg daily. ?Blood work done today. ?Advised to monitor blood pressure readings at home daily for the next several weeks and keep a log. ?Follow-up in 3 months, earlier as needed. ? ? ?

## 2022-04-20 NOTE — Patient Instructions (Signed)
Hypertension, Adult High blood pressure (hypertension) is when the force of blood pumping through the arteries is too strong. The arteries are the blood vessels that carry blood from the heart throughout the body. Hypertension forces the heart to work harder to pump blood and may cause arteries to become narrow or stiff. Untreated or uncontrolled hypertension can lead to a heart attack, heart failure, a stroke, kidney disease, and other problems. A blood pressure reading consists of a higher number over a lower number. Ideally, your blood pressure should be below 120/80. The first ("top") number is called the systolic pressure. It is a measure of the pressure in your arteries as your heart beats. The second ("bottom") number is called the diastolic pressure. It is a measure of the pressure in your arteries as the heart relaxes. What are the causes? The exact cause of this condition is not known. There are some conditions that result in high blood pressure. What increases the risk? Certain factors may make you more likely to develop high blood pressure. Some of these risk factors are under your control, including: Smoking. Not getting enough exercise or physical activity. Being overweight. Having too much fat, sugar, calories, or salt (sodium) in your diet. Drinking too much alcohol. Other risk factors include: Having a personal history of heart disease, diabetes, high cholesterol, or kidney disease. Stress. Having a family history of high blood pressure and high cholesterol. Having obstructive sleep apnea. Age. The risk increases with age. What are the signs or symptoms? High blood pressure may not cause symptoms. Very high blood pressure (hypertensive crisis) may cause: Headache. Fast or irregular heartbeats (palpitations). Shortness of breath. Nosebleed. Nausea and vomiting. Vision changes. Severe chest pain, dizziness, and seizures. How is this diagnosed? This condition is diagnosed by  measuring your blood pressure while you are seated, with your arm resting on a flat surface, your legs uncrossed, and your feet flat on the floor. The cuff of the blood pressure monitor will be placed directly against the skin of your upper arm at the level of your heart. Blood pressure should be measured at least twice using the same arm. Certain conditions can cause a difference in blood pressure between your right and left arms. If you have a high blood pressure reading during one visit or you have normal blood pressure with other risk factors, you may be asked to: Return on a different day to have your blood pressure checked again. Monitor your blood pressure at home for 1 week or longer. If you are diagnosed with hypertension, you may have other blood or imaging tests to help your health care provider understand your overall risk for other conditions. How is this treated? This condition is treated by making healthy lifestyle changes, such as eating healthy foods, exercising more, and reducing your alcohol intake. You may be referred for counseling on a healthy diet and physical activity. Your health care provider may prescribe medicine if lifestyle changes are not enough to get your blood pressure under control and if: Your systolic blood pressure is above 130. Your diastolic blood pressure is above 80. Your personal target blood pressure may vary depending on your medical conditions, your age, and other factors. Follow these instructions at home: Eating and drinking  Eat a diet that is high in fiber and potassium, and low in sodium, added sugar, and fat. An example of this eating plan is called the DASH diet. DASH stands for Dietary Approaches to Stop Hypertension. To eat this way: Eat   plenty of fresh fruits and vegetables. Try to fill one half of your plate at each meal with fruits and vegetables. Eat whole grains, such as whole-wheat pasta, brown rice, or whole-grain bread. Fill about one  fourth of your plate with whole grains. Eat or drink low-fat dairy products, such as skim milk or low-fat yogurt. Avoid fatty cuts of meat, processed or cured meats, and poultry with skin. Fill about one fourth of your plate with lean proteins, such as fish, chicken without skin, beans, eggs, or tofu. Avoid pre-made and processed foods. These tend to be higher in sodium, added sugar, and fat. Reduce your daily sodium intake. Many people with hypertension should eat less than 1,500 mg of sodium a day. Do not drink alcohol if: Your health care provider tells you not to drink. You are pregnant, may be pregnant, or are planning to become pregnant. If you drink alcohol: Limit how much you have to: 0-1 drink a day for women. 0-2 drinks a day for men. Know how much alcohol is in your drink. In the [place] one drink equals one 12 oz bottle of beer (355 mL), one 5 oz glass of wine (148 mL), or one 1 oz glass of hard liquor (44 mL). Lifestyle  Work with your health care provider to maintain a healthy body weight or to lose weight. Ask what an ideal weight is for you. Get at least 30 minutes of exercise that causes your heart to beat faster (aerobic exercise) most days of the week. Activities may include walking, swimming, or biking. Include exercise to strengthen your muscles (resistance exercise), such as Pilates or lifting weights, as part of your weekly exercise routine. Try to do these types of exercises for 30 minutes at least 3 days a week. Do not use any products that contain nicotine or tobacco. These products include cigarettes, chewing tobacco, and vaping devices, such as e-cigarettes. If you need help quitting, ask your health care provider. Monitor your blood pressure at home as told by your health care provider. Keep all follow-up visits. This is important. Medicines Take over-the-counter and prescription medicines only as told by your health care provider. Follow directions carefully. Blood  pressure medicines must be taken as prescribed. Do not skip doses of blood pressure medicine. Doing this puts you at risk for problems and can make the medicine less effective. Ask your health care provider about side effects or reactions to medicines that you should watch for. Contact a health care provider if you: Think you are having a reaction to a medicine you are taking. Have headaches that keep coming back (recurring). Feel dizzy. Have swelling in your ankles. Have trouble with your vision. Get help right away if you: Develop a severe headache or confusion. Have unusual weakness or numbness. Feel faint. Have severe pain in your chest or abdomen. Vomit repeatedly. Have trouble breathing. These symptoms may be an emergency. Get help right away. Call 911. Do not wait to see if the symptoms will go away. Do not drive yourself to the hospital. Summary Hypertension is when the force of blood pumping through your arteries is too strong. If this condition is not controlled, it may put you at risk for serious complications. Your personal target blood pressure may vary depending on your medical conditions, your age, and other factors. For most people, a normal blood pressure is less than 120/80. Hypertension is treated with lifestyle changes, medicines, or a combination of both. Lifestyle changes include losing weight, eating a healthy,   low-sodium diet, exercising more, and limiting alcohol. This information is not intended to replace advice given to you by your health care provider. Make sure you discuss any questions you have with your health care provider. Document Revised: 10/11/2021 Document Reviewed: 10/11/2021 [employer] Patient Education  2023 [employer]  

## 2022-04-20 NOTE — Progress Notes (Signed)
Ryan Kramer ?31 y.o. ? ? ?Chief Complaint  ?Patient presents with  ? Annual Exam  ? Concern about blood pressure   ? ? ?HISTORY OF PRESENT ILLNESS: ?This is a 31 y.o. male here for annual exam. ?Has concerns about blood pressure. ? ?HPI ? ? ?Prior to Admission medications   ?Not on File  ? ? ?No Known Allergies ? ?There are no problems to display for this patient. ? ? ?No past medical history on file. ? ?Past Surgical History:  ?Procedure Laterality Date  ? SHOULDER SURGERY    ? ? ?Social History  ? ?Socioeconomic History  ? Marital status: Single  ?  Spouse name: Not on file  ? Number of children: Not on file  ? Years of education: Not on file  ? Highest education level: Not on file  ?Occupational History  ? Not on file  ?Tobacco Use  ? Smoking status: Every Day  ?  Types: Cigars  ? Smokeless tobacco: Never  ? Tobacco comments:  ?  once a week  ?Substance and Sexual Activity  ? Alcohol use: Yes  ?  Comment: 2-3 drinks a week  ? Drug use: No  ? Sexual activity: Not on file  ?Other Topics Concern  ? Not on file  ?Social History Narrative  ? Not on file  ? ?Social Determinants of Health  ? ?Financial Resource Strain: Not on file  ?Food Insecurity: Not on file  ?Transportation Needs: Not on file  ?Physical Activity: Not on file  ?Stress: Not on file  ?Social Connections: Not on file  ?Intimate Partner Violence: Not on file  ? ? ?Family History  ?Problem Relation Age of Onset  ? Hypertension Mother   ? Hypertension Father   ? ? ? ?Review of Systems  ?Constitutional: Negative.  Negative for chills and fever.  ?HENT: Negative.  Negative for congestion and sore throat.   ?Respiratory: Negative.  Negative for cough and shortness of breath.   ?Cardiovascular: Negative.  Negative for chest pain and palpitations.  ?Gastrointestinal:  Negative for abdominal pain, diarrhea, nausea and vomiting.  ?Genitourinary: Negative.   ?Skin: Negative.  Negative for rash.  ?Neurological: Negative.  Negative for dizziness and headaches.   ?All other systems reviewed and are negative. ? ?Today's Vitals  ? 04/20/22 7824 04/20/22 0902  ?BP: (!) 154/108 (!) 156/110  ?Pulse: 83   ?Temp: 98.2 ?F (36.8 ?C)   ?TempSrc: Oral   ?SpO2: 95%   ?Weight: (!) 382 lb (173.3 kg)   ?Height: 6' (1.829 m)   ? ?Body mass index is 51.81 kg/m?. ? ?Physical Exam ?Vitals reviewed.  ?Constitutional:   ?   Appearance: Normal appearance. He is obese.  ?HENT:  ?   Head: Normocephalic.  ?   Right Ear: Tympanic membrane, ear canal and external ear normal.  ?   Left Ear: Tympanic membrane, ear canal and external ear normal.  ?   Mouth/Throat:  ?   Mouth: Mucous membranes are moist.  ?   Pharynx: Oropharynx is clear.  ?Eyes:  ?   Extraocular Movements: Extraocular movements intact.  ?   Conjunctiva/sclera: Conjunctivae normal.  ?   Pupils: Pupils are equal, round, and reactive to light.  ?Cardiovascular:  ?   Rate and Rhythm: Normal rate and regular rhythm.  ?   Pulses: Normal pulses.  ?   Heart sounds: Normal heart sounds.  ?Pulmonary:  ?   Effort: Pulmonary effort is normal.  ?   Breath sounds: Normal breath  sounds.  ?Abdominal:  ?   General: There is no distension.  ?   Palpations: Abdomen is soft.  ?   Tenderness: There is no abdominal tenderness.  ?Musculoskeletal:  ?   Cervical back: No tenderness.  ?Lymphadenopathy:  ?   Cervical: No cervical adenopathy.  ?Skin: ?   General: Skin is warm and dry.  ?   Capillary Refill: Capillary refill takes less than 2 seconds.  ?Neurological:  ?   General: No focal deficit present.  ?   Mental Status: He is alert and oriented to person, place, and time.  ?Psychiatric:     ?   Mood and Affect: Mood normal.     ?   Behavior: Behavior normal.  ? ? ? ?ASSESSMENT & PLAN: ?Problem List Items Addressed This Visit   ? ?  ? Cardiovascular and Mediastinum  ? Uncontrolled hypertension - Primary  ?  Cardiovascular risks associated with uncontrolled hypertension discussed. ?BP Readings from Last 3 Encounters:  ?04/20/22 (!) 156/110  ?04/17/22 (!)  155/108  ?04/06/21 (!) 152/95  ?Dietary approaches to stop hypertension discussed ?We will start valsartan-HCTZ 160-12.5 mg daily. ?Blood work done today. ?Advised to monitor blood pressure readings at home daily for the next several weeks and keep a log. ?Follow-up in 3 months, earlier as needed. ? ? ?  ?  ? Relevant Medications  ? valsartan-hydrochlorothiazide (DIOVAN-HCT) 160-12.5 MG tablet  ? Other Relevant Orders  ? CBC with Differential/Platelet  ? Comprehensive metabolic panel  ? Hemoglobin A1c  ? Lipid panel  ?  ? Other  ? Morbid obesity (HCC)  ?  Diet and nutrition discussed.  Advised to reduce the amount of daily carbohydrate intake and decrease number of daily calories ingested. ?We will refer to medical weight management center. ? ?  ?  ? Relevant Orders  ? CBC with Differential/Platelet  ? Comprehensive metabolic panel  ? Hemoglobin A1c  ? Lipid panel  ? Amb Ref to Medical Weight Management  ? ?Other Visit Diagnoses   ? ? Encounter to establish care      ? ?  ? ?Patient Instructions  ?Hypertension, Adult ?High blood pressure (hypertension) is when the force of blood pumping through the arteries is too strong. The arteries are the blood vessels that carry blood from the heart throughout the body. Hypertension forces the heart to work harder to pump blood and may cause arteries to become narrow or stiff. Untreated or uncontrolled hypertension can lead to a heart attack, heart failure, a stroke, kidney disease, and other problems. ?A blood pressure reading consists of a higher number over a lower number. Ideally, your blood pressure should be below 120/80. The first ("top") number is called the systolic pressure. It is a measure of the pressure in your arteries as your heart beats. The second ("bottom") number is called the diastolic pressure. It is a measure of the pressure in your arteries as the heart relaxes. ?What are the causes? ?The exact cause of this condition is not known. There are some  conditions that result in high blood pressure. ?What increases the risk? ?Certain factors may make you more likely to develop high blood pressure. Some of these risk factors are under your control, including: ?Smoking. ?Not getting enough exercise or physical activity. ?Being overweight. ?Having too much fat, sugar, calories, or salt (sodium) in your diet. ?Drinking too much alcohol. ?Other risk factors include: ?Having a personal history of heart disease, diabetes, high cholesterol, or kidney disease. ?Stress. ?Having a  family history of high blood pressure and high cholesterol. ?Having obstructive sleep apnea. ?Age. The risk increases with age. ?What are the signs or symptoms? ?High blood pressure may not cause symptoms. Very high blood pressure (hypertensive crisis) may cause: ?Headache. ?Fast or irregular heartbeats (palpitations). ?Shortness of breath. ?Nosebleed. ?Nausea and vomiting. ?Vision changes. ?Severe chest pain, dizziness, and seizures. ?How is this diagnosed? ?This condition is diagnosed by measuring your blood pressure while you are seated, with your arm resting on a flat surface, your legs uncrossed, and your feet flat on the floor. The cuff of the blood pressure monitor will be placed directly against the skin of your upper arm at the level of your heart. Blood pressure should be measured at least twice using the same arm. Certain conditions can cause a difference in blood pressure between your right and left arms. ?If you have a high blood pressure reading during one visit or you have normal blood pressure with other risk factors, you may be asked to: ?Return on a different day to have your blood pressure checked again. ?Monitor your blood pressure at home for 1 week or longer. ?If you are diagnosed with hypertension, you may have other blood or imaging tests to help your health care provider understand your overall risk for other conditions. ?How is this treated? ?This condition is treated by  making healthy lifestyle changes, such as eating healthy foods, exercising more, and reducing your alcohol intake. You may be referred for counseling on a healthy diet and physical activity. ?Your health care provider

## 2022-04-25 ENCOUNTER — Encounter: Payer: Self-pay | Admitting: Emergency Medicine

## 2022-04-25 DIAGNOSIS — Z1321 Encounter for screening for nutritional disorder: Secondary | ICD-10-CM

## 2022-04-25 NOTE — Telephone Encounter (Signed)
Okay to order thanks

## 2022-04-25 NOTE — Telephone Encounter (Signed)
Patient is requesting labs to check his vitamin D levels. Please advise  ? ? ?

## 2022-05-01 ENCOUNTER — Other Ambulatory Visit (INDEPENDENT_AMBULATORY_CARE_PROVIDER_SITE_OTHER): Payer: Self-pay

## 2022-05-01 ENCOUNTER — Other Ambulatory Visit: Payer: Self-pay | Admitting: Emergency Medicine

## 2022-05-01 DIAGNOSIS — Z1321 Encounter for screening for nutritional disorder: Secondary | ICD-10-CM

## 2022-05-01 LAB — VITAMIN D 25 HYDROXY (VIT D DEFICIENCY, FRACTURES): VITD: 15.74 ng/mL — ABNORMAL LOW (ref 30.00–100.00)

## 2022-05-01 MED ORDER — VITAMIN D (ERGOCALCIFEROL) 1.25 MG (50000 UNIT) PO CAPS: 50000.0000 [IU] | ORAL_CAPSULE | ORAL | 1 refills | Status: AC

## 2022-07-24 ENCOUNTER — Encounter: Payer: Self-pay | Admitting: Emergency Medicine

## 2022-07-24 NOTE — Telephone Encounter (Signed)
Please schedule office visit. Thanks 

## 2023-04-03 ENCOUNTER — Ambulatory Visit
Admission: EM | Admit: 2023-04-03 | Discharge: 2023-04-03 | Disposition: A | Discharge status: Home / Self Care | Payer: Self-pay

## 2023-04-03 DIAGNOSIS — R369 Urethral discharge, unspecified: Secondary | ICD-10-CM | POA: Insufficient documentation

## 2023-04-03 DIAGNOSIS — R300 Dysuria: Secondary | ICD-10-CM | POA: Insufficient documentation

## 2023-04-03 DIAGNOSIS — I10 Essential (primary) hypertension: Secondary | ICD-10-CM | POA: Insufficient documentation

## 2023-04-03 LAB — POCT URINALYSIS DIP (MANUAL ENTRY)
Glucose, UA: NEGATIVE mg/dL
Nitrite, UA: NEGATIVE
Protein Ur, POC: 100 mg/dL — AB
Spec Grav, UA: >=1.03 — AB (ref 1.010–1.025)
Urobilinogen, UA: 1 E.U./dL
pH, UA: 6 (ref 5.0–8.0)

## 2023-04-03 MED ORDER — CEFTRIAXONE SODIUM 1 G IJ SOLR
1.0000 g | Freq: Once | INTRAMUSCULAR | Status: AC
  Administered 2023-04-03: 1 g via INTRAMUSCULAR

## 2023-04-03 MED ORDER — CEFTRIAXONE SODIUM 500 MG IJ SOLR: 500.0000 mg | Freq: Once | INTRAMUSCULAR | Status: DC

## 2023-04-03 MED ORDER — DOXYCYCLINE HYCLATE 100 MG PO CAPS: 100.0000 mg | ORAL_CAPSULE | Freq: Two times a day (BID) | ORAL | 0 refills | Status: AC

## 2023-04-03 NOTE — ED Triage Notes (Signed)
Pt c/o burning sensation at tip of penis for three days and penile discharge this am. Request STI check. No back/abd pain. No N/V.

## 2023-04-03 NOTE — ED Provider Notes (Signed)
[facility]  Note:  This document was prepared using Dragon voice recognition software and may include unintentional dictation errors.  MRN: [id number] DOB: [birth year] DATE: 04/03/23   Subjective:  Chief Complaint:  Chief Complaint  Patient presents with   Exposure to STD    HPI: Ryan Kramer is a 32 y.o. male presenting for dysuria and penile discharge for 3 days.  Patient states he noticed a burning sensation at the tip of his penis with urinating approximately 3 days ago.  He states this morning he noticed penile discharge.  He reports being sexually active a few days ago with a male partner that he has been sexually active with before, but reports unprotected sex at that time.  He denies any penile lesions.  No prior history of STDs. Denies fever, nausea/vomiting, back pain, abdominal pain. Endorses penile discharge, dysuria. Presents NAD.  Prior to Admission medications   Medication Sig Start Date End Date Taking? Authorizing Provider  valsartan-hydrochlorothiazide (DIOVAN-HCT) 160-12.5 MG tablet Take 1 tablet by mouth daily. 04/20/22   Georgina Quint, MD  Vitamin D, Ergocalciferol, (DRISDOL) 1.25 MG (50000 UNIT) CAPS capsule Take 1 capsule (50,000 Units total) by mouth every 7 (seven) days. 05/01/22   Georgina Quint, MD     No Known Allergies  History:   History reviewed. No pertinent past medical history.   Past Surgical History:  Procedure Laterality Date   SHOULDER SURGERY      Family History  Problem Relation Age of Onset   Hypertension Mother    Hypertension Father     Social History   Tobacco Use   Smoking status: Every Day    Types: Cigars   Smokeless tobacco: Never   Tobacco comments:    once a week  Vaping Use   Vaping Use: Never used  Substance Use Topics   Alcohol use: Yes    Comment: 2-3 drinks a week   Drug use: No    Review of Systems  Constitutional:  Negative for fever.  Gastrointestinal:  Negative for abdominal  pain, nausea and vomiting.  Genitourinary:  Positive for dysuria and penile discharge. Negative for flank pain and genital sores.  Musculoskeletal:  Negative for back pain.     Objective:   Vitals: BP (!) 161/84 (BP Location: Right Arm)   Pulse 98   Temp 97.8 F (36.6 C) (Oral)   Resp 20   SpO2 95%   Physical Exam Constitutional:      General: He is not in acute distress.    Appearance: Normal appearance. He is well-developed. He is morbidly obese. He is not ill-appearing or toxic-appearing.  HENT:     Head: Normocephalic and atraumatic.  Cardiovascular:     Rate and Rhythm: Normal rate and regular rhythm.     Heart sounds: Normal heart sounds.  Pulmonary:     Effort: Pulmonary effort is normal.     Breath sounds: Normal breath sounds.     Comments: Clear to auscultation bilaterally  Abdominal:     General: Bowel sounds are normal.     Palpations: Abdomen is soft.     Tenderness: There is no abdominal tenderness. There is no right CVA tenderness or left CVA tenderness.  Skin:    General: Skin is warm and dry.  Neurological:     General: No focal deficit present.     Mental Status: He is alert.  Psychiatric:        Mood and Affect: Mood and affect normal.  Results:  Labs: Results for orders placed or performed during the hospital encounter of 04/03/23 (from the past 24 hour(s))  POCT urinalysis dipstick     Status: Abnormal   Collection Time: 04/03/23  2:04 PM  Result Value Ref Range   Color, UA yellow yellow   Clarity, UA cloudy (A) clear   Glucose, UA negative negative mg/dL   Bilirubin, UA small (A) negative   Ketones, POC UA small (15) (A) negative mg/dL   Spec Grav, UA >=1.610 (A) 1.010 - 1.025   Blood, UA moderate (A) negative   pH, UA 6.0 5.0 - 8.0   Protein Ur, POC =100 (A) negative mg/dL   Urobilinogen, UA 1.0 0.2 or 1.0 E.U./dL   Nitrite, UA Negative Negative   Leukocytes, UA Small (1+) (A) Negative    Radiology: No results found.   UC  Course/Treatments:  Procedures: Procedures   Medications Ordered in UC: Medications  cefTRIAXone (ROCEPHIN) injection 1 g (has no administration in time range)     Assessment and Plan :     ICD-10-CM   1. Penile discharge  R36.9     2. Dysuria  R30.0     3. Essential hypertension  I10      Penile discharge: Afebrile, nontoxic-appearing, NAD. VSS. DDX includes but not limited to: Gonorrhea, chlamydia, trichomoniasis UA was positive for moderate amount of blood and leukocytes today in office.  Urine culture is pending.  Despite hematuria, STD suspected given discharge and recent unprotected sex.  Will treat empirically for STDs as well as Rocephin given today in office will provide some coverage for possible UTI.  Doxycycline 100 mg twice daily was prescribed to empirically treat chlamydia as well as ceftriaxone 1 g IM was given today in office to empirically treat gonorrhea.  Safe sex precautions advised.  Patient refused HIV and RPR testing at this time stating he sees his PCP next month and has routine testing done with him.  Strict ED precautions were given and patient verbalized understanding.  Dysuria: Afebrile, nontoxic-appearing, NAD. VSS. DDX includes but not limited to: Gonorrhea, chlamydia, trichomoniasis, cystitis, pyelonephritis UA was positive for moderate amount of blood and leukocytes today in office.  Urine culture is pending.  Despite hematuria, STD suspected given discharge and recent unprotected sex.  Will treat empirically for STDs as well as Rocephin given today in office will provide some coverage for possible UTI.  If urine culture is positive, will add another antibiotic at that time.  If urine culture is negative, will advise patient to follow-up with PCP regarding moderate amount of blood noted in his urine today.  Strict ED precautions were given and patient verbalized understanding.  Essential hypertension: Afebrile, nontoxic-appearing, NAD.  VSS. Uncontrolled Currently asymptomatic.  Patient states he does not take his blood pressure medication that was prescribed for him.  Recommend he start valsartan-hydrochlorothiazide 160-12.5 mg daily as soon as possible.  He needs to follow-up with his PCP as soon as possible as well to monitor hypertension. Strict ED precautions were given and patient verbalized understanding.   ED Discharge Orders          Ordered    doxycycline (VIBRAMYCIN) 100 MG capsule  2 times daily        04/03/23 1357             PDMP not reviewed this encounter.     Zarinah Oviatt P, PA-C 04/03/23 1414

## 2023-04-03 NOTE — Discharge Instructions (Addendum)
Your swab was sent to the lab for further testing.  You will be called with results. Doxycycline is an antibiotic given to treat chlamydia. Take the prescription as directed.    You were given an injection (Rocephin) for your discharge today. This will help with the infection. It is an antibiotic used to treat gonorrhea.  You should avoid all sexual activity until you have been notified of all your results and have undergone any necessary treatment.  If you are positive, it is recommended that you inform all sexual partners so they can treat be treated as well before having sex again.   Your BP was elevated at this visit. I recommend you follow up with your PCP as soon as possible. Monitor BP daily preferably at the same time each day. If readings persistently >140/90, follow up with our office or your PCP sooner. If chest pain, shortness of breath, headache, numbness/tingling, slurred speech, or facial drooping develop, go directly to the ER.

## 2023-04-04 LAB — URINE CULTURE: Culture: NO GROWTH

## 2023-04-04 LAB — CYTOLOGY, (ORAL, ANAL, URETHRAL) ANCILLARY ONLY
Chlamydia: NEGATIVE
Comment: NEGATIVE
Comment: NEGATIVE
Comment: NORMAL
Neisseria Gonorrhea: POSITIVE — AB

## 2023-04-09 ENCOUNTER — Ambulatory Visit
Admission: EM | Admit: 2023-04-09 | Discharge: 2023-04-09 | Disposition: A | Discharge status: Home / Self Care | Payer: Self-pay | Attending: Internal Medicine | Admitting: Internal Medicine

## 2023-04-09 DIAGNOSIS — R369 Urethral discharge, unspecified: Secondary | ICD-10-CM | POA: Insufficient documentation

## 2023-04-09 DIAGNOSIS — I10 Essential (primary) hypertension: Secondary | ICD-10-CM | POA: Insufficient documentation

## 2023-04-09 DIAGNOSIS — R300 Dysuria: Secondary | ICD-10-CM | POA: Insufficient documentation

## 2023-04-09 NOTE — ED Triage Notes (Signed)
Pt here for Cytology recollect only.

## 2023-04-11 LAB — CYTOLOGY, (ORAL, ANAL, URETHRAL) ANCILLARY ONLY
Chlamydia: NEGATIVE
Comment: NEGATIVE
Comment: NEGATIVE
Comment: NORMAL
Neisseria Gonorrhea: POSITIVE — AB
Trichomonas: NEGATIVE

## 2023-04-18 ENCOUNTER — Ambulatory Visit (INDEPENDENT_AMBULATORY_CARE_PROVIDER_SITE_OTHER): Payer: Self-pay | Admitting: Family Medicine

## 2023-04-18 ENCOUNTER — Encounter: Payer: Self-pay | Admitting: Family Medicine

## 2023-04-18 VITALS — BP 151/98 | HR 81 | Ht 72.0 in | Wt 376.5 lb

## 2023-04-18 DIAGNOSIS — G4733 Obstructive sleep apnea (adult) (pediatric): Secondary | ICD-10-CM

## 2023-04-18 NOTE — Progress Notes (Signed)
Chief Complaint  Patient presents with   Room 11    Pt is here Alone. Pt states that things have been going good with CPAP. Pt has a desire to get off his CPAP. Pt states that he has good sleep with his CPAP.  Pt states no dry mouth or soar throat with CPAP.     HISTORY OF PRESENT ILLNESS:  04/18/23 ALL: Ryan Kramer returns for follow up for OSA on CPAP. He continues to do well. He is using therapy nightly for about 8 hours. He continues to do well on therapy. He is using CPAP most every night. He does feel better when using therapy. He reports losing about 27lbs in the past three months. He has returned to the gym but admits that he hasn't gone recently. Home BP was 142/86 last week. He was started on HTN agent last year but didn't like the way it made him feel. He has follow up scheduled soon.     04/17/2022 ALL: [name] returns for follow up for OSA on CPAP. He continues to do well. He is using CPAP nightly for about 8 hours. He denies concerns with machine or supplies. He is unsure of BP readings at home. He is due for CPE with PCP. He is asymptomatic, today.     04/06/2021 ALL: [name] returns for follow up on headaches and OSA on CPAP. He has resumed CPAP therapy. He is doing well on therapy. He feels that he is sleeping better. Headaches have resolved. He denies concerns with CPAP or supplies.      10/06/2020 ALL:  Ryan Kramer is a 32 y.o. male here today for follow up for headaches. He has discontinued amitriptyline. He did not feel it was needed. He has not had any recent headaches. He does note some tension if he doesn't drink water. EEG was normal. No seizure like activity. He has not used CPAP in at least 2 months. He has felt well until the past week. He traveled to [place] and when he got back he had some neck tightness and his ears are popping. He feels this is related to traveling. No fevers or pain with movement of neck. No other signs of infection. He is feeling well and  without complaints. He would ike to return to work and needs neurological clearance. He is followed by PCP regularly.    HISTORY (copied from my note on 04/13/2020)  Ryan Kramer is a 32 y.o. male here today for follow up for headaches. He did start amitriptyline. He titrated dose to 50mg  at bedtime. He feels that headaches are better. He does continue to have intermittent headaches (1-2 weekly), neck stiffness and right eye weakness. Eye weakness is described as a "tired feeling" with blurry vision in the evenings. No drooping of eye lid. No vision loss. He feels off balance at times. He endorses intermittent, random, generalized numbness and tingling. No obvious seizure activity. MRI was normal. Mom is asking to proceed with EEG. He tried stopping all medications to see how he felt but states that after about a week, symptoms worsened. When he resumed meds, symptoms improved. He has not seen ophthalmology. He reports having appt next week. BP is elevated today (141/103). His mom usually checks BP at home but he is unclear about readings. He thinks that last week it was 133/87. He reports using CPAP every night. He denies any concerns with CPAP machine or supplies. Compliance report dated 03/13/2020-04/11/2020 reveals that he has used  CPAP 2 of the past 30 days. There was a significant leak in the 95th percentile of 41.4L/min.    HISTORY: (copied from Dr Frances Furbish note on 03/10/2020)   Ryan Kramer is a 32 year old right-handed gentleman with an underlying benign medical history other than morbid obesity with a BMI of over 45, who presents for follow-up consultation of his obstructive sleep apnea and also recent emergency room visit for increase in headaches and diagnosis of viral meningitis on 02/22/20.  He is accompanied by his mother today.  He missed an appointment for sleep apnea follow-up in January 2021. He missed an appointment on 02/26/20.  I first met him on 10/06/2019 at the request of his primary care  physician, at which time the patient reported intermittent headaches, snoring and daytime somnolence.  He was advised to proceed with a sleep study.  He had a baseline sleep study on 11/01/2019 and I reviewed the results: mild obstructive sleep apnea, moderate during supine sleep, severe in REM sleep with a total AHI of 13.8/hour, REM AHI of 36.7/hour, supine AHI of 17.9/hour, and O2 nadir of 83%. We called him with his test results and he was advised to start AutoPap therapy due to his history of headaches and daytime somnolence reported.   He recently presented to the emergency room on 02/21/2020 with a 2-week history of headaches and he started having numbness in his extremities.  He was treated symptomatically with a migraine cocktail.  He had multiple imaging tests.  He had a head CT without contrast on 02/21/2020 and I reviewed the results: IMPRESSION: No acute intracranial findings.   Symmetric prominence of the bilateral parotid glands, a nonspecific finding. 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 This is a typically reversible entity with a wide differential including medications (primarily anti-seizure), infection (no additional imaging evidence of this), metabolic disease, and some associations such as vaccination. A repeat study in 2-4 weeks could be considered.  Normal vascular imaging.   He had a neurology consultation in the emergency room on 02/22/2020 and reported neck stiffness.  He had a lumbar puncture which showed an opening pressure of 32 cm, elevated protein and elevated white cell count with lymphocytic predominance.   He was diagnosed with viral meningitis.   He presented to the emergency room on 02/23/2020 with ongoing  headaches.  He was treated with IV hydration, Decadron, fentanyl and droperidol.   He had a telemedicine follow-up appointment with his primary care physician on 02/24/2020 and reported feeling improved.   CSF culture was negative.  HSV PCR for HSV 1 and 2 was negative in the CSF.   Today, 03/10/2020: I reviewed his AutoPap compliance data from 02/03/20 through 03/03/20, which is a total of 30 days, during which time he used his autoPAP 2 days.    He reports a generalized headache.  His mother provides most of his history.  She reports that he has had nausea, he is in bed most of the day.  She reports that he has taken Tylenol and Motrin with little relief.  He reports that he stopped using his AutoPap.  He did not have any problems tolerating the pressure of the mask.  He indicates that he has a full facemask.  He  would be willing to restart his AutoPap.  His mother would like for him to have something for pain.  I explained to her that at the most we can provide a Toradol injection in the office but she also reported that he had been taking 600 to 800 mg of ibuprofen and 2 extra strength Tylenol alternating, which is why I did not feel comfortable adding yet another nonsteroidal anti-inflammatory medication by injection in the office today.  She did request a prescription for his nausea medicine, he had been given a prescription for Zofran which I reviewed. He denies any one-sided weakness or numbness or tingling. She is requesting a brain MRI with contrast.  I explained to her that I would be happy to order a repeat brain MRI with and without contrast but in the outpatient setting it is always slower even with an urgent request than in the inpatient setting or ER setting.  He drinks about 5 or 6 cups of water per day, he does not drink any sodas or caffeine currently.  He is not sleeping very well.  His mother reports that he has not slept well in the past 3 to 4 weeks.   Previously:    10/06/2019: (He)  reports snoring and excessive daytime somnolence, intermittent headaches.  I reviewed your office note from 09/24/2019.  He was recently tested for COVID-19 and tested negative.  His blood pressure is elevated today but was noted to be normal at his visit with you.  He reports difficulty initiating and maintaining sleep for the past 2 years.  His snoring has been noted by his uncle and his mother.  He lives with his mother, paternal uncle has sleep apnea and uses a CPAP machine.  Patient reports that he has been told he has stops in his breathing.  He also endorses restless leg symptoms and kicking in his sleep.  He has a TV in the bedroom and does not always leave it on at night but sometimes it stays on.  He has no pets in the household.  He estimates that he gets about 4 to 5 hours of sleep on any given night.  He has tried melatonin which did not help.  He does not currently drink any caffeine.  He has reduced his alcohol intake and reports that he was drinking quite heavily.  He is now drinking approximately half a bottle of liquor once or twice a week.  He used to drink daily.  He smokes occasional cigars, no cigarettes.  He is trying to lose weight and has lost some weight in the past 2 months but in the past year, he had gained about 80 pounds.  Bedtime is generally between 10 and 10:30 PM and rise time is between 6 and 6:30 AM.  He has had rare morning headaches, he has nocturia about 2-4 times per average night.  Epworth sleepiness score is 9 out of 24, fatigue severity score is 43 out of 63.    REVIEW OF SYSTEMS: Out of a complete 14 system review of symptoms, the patient complains only of the following symptoms, fatigue and all other reviewed systems are negative.  ESS: 4/24   ALLERGIES: No Known Allergies   HOME MEDICATIONS: Outpatient Medications Prior to Visit  Medication Sig Dispense Refill   Vitamin D, Ergocalciferol, (DRISDOL) 1.25 MG (50000 UNIT) CAPS capsule Take 1 capsule (50,000  Units total) by mouth every 7 (seven) days. 7 capsule 1   amitriptyline (ELAVIL) 25 MG tablet 1/2  TAB AT BEDTIME X1 WEEK, 1 NIGHTLY X1 WEEK, 1&1/2 NIGHTLY X1 WEEK, THEN 2 NIGHTLY THEREAFTER. (Patient not taking: Reported on 04/18/2023)     valsartan-hydrochlorothiazide (DIOVAN-HCT) 160-12.5 MG tablet Take 1 tablet by mouth daily. (Patient not taking: Reported on 04/18/2023) 90 tablet 3   No facility-administered medications prior to visit.     PAST MEDICAL HISTORY: History reviewed. No pertinent past medical history.   PAST SURGICAL HISTORY: Past Surgical History:  Procedure Laterality Date   SHOULDER SURGERY       FAMILY HISTORY: Family History  Problem Relation Age of Onset   Hypertension Mother    Hypertension Father      SOCIAL HISTORY: Social History   Socioeconomic History   Marital status: Single    Spouse name: Not on file   Number of children: Not on file   Years of education: Not on file   Highest education level: Not on file  Occupational History   Not on file  Tobacco Use   Smoking status: Every Day    Types: Cigars   Smokeless tobacco: Never   Tobacco comments:    once a week  Vaping Use   Vaping Use: Never used  Substance and Sexual Activity   Alcohol use: Yes    Comment: 2-3 drinks a week   Drug use: No   Sexual activity: Not on file  Other Topics Concern   Not on file  Social History Narrative   Right and Left Handed   No Caffeine Use    Social Determinants of Health   Financial Resource Strain: Not on file  Food Insecurity: Not on file  Transportation Needs: Not on file  Physical Activity: Not on file  Stress: Not on file  Social Connections: Not on file  Intimate Partner Violence: Not on file      PHYSICAL EXAM  Vitals:   04/18/23 1359  BP: (!) 136/93  Pulse: 78  Weight: (!) 376 lb 8 oz (170.8 kg)  Height: 6' (1.829 m)     Body mass index is 51.06 kg/m.   Generalized: Well developed, in no acute distress    Neurological examination  Mentation: Alert oriented to time, place, history taking. Follows all commands speech and language fluent Cranial nerve II-XII: Pupils were equal round reactive to light. Extraocular movements were full, visual field were full on confrontational test. Facial sensation and strength were normal. Head turning and shoulder shrug  were normal and symmetric. Motor: The motor testing reveals 5 over 5 strength of all 4 extremities. Good symmetric motor tone is noted throughout.  Sensory: Sensory testing is intact to soft touch on all 4 extremities. No evidence of extinction is noted.  Coordination: Cerebellar testing reveals good finger-nose-finger and heel-to-shin bilaterally.  Gait and station: Gait is normal.    DIAGNOSTIC DATA (LABS, IMAGING, TESTING) - I reviewed patient records, labs, notes, testing and imaging myself where available.  Lab Results  Component Value Date   WBC 13.2 (H) 04/20/2022   HGB 14.5 04/20/2022   HCT 44.8 04/20/2022   MCV 92.3 04/20/2022   PLT 236.0 04/20/2022      Component Value Date/Time   NA 137 04/20/2022 0922   NA 137 03/10/2020 0925   K 4.2 04/20/2022 0922   CL 104 04/20/2022 0922   CO2 26 04/20/2022 0922   GLUCOSE 101 (H) 04/20/2022 0922   BUN 13 04/20/2022 0922   BUN 11 03/10/2020 0925   CREATININE 1.14 04/20/2022 0922   CALCIUM 9.0 04/20/2022  0922   PROT 7.4 04/20/2022 0922   PROT 7.5 03/10/2020 0925   ALBUMIN 4.0 04/20/2022 0922   ALBUMIN 4.4 03/10/2020 0925   AST 16 04/20/2022 0922   ALT 26 04/20/2022 0922   ALKPHOS 83 04/20/2022 0922   BILITOT 0.6 04/20/2022 0922   BILITOT 0.6 03/10/2020 0925   GFRNONAA 85 03/10/2020 0925   GFRAA 98 03/10/2020 0925   Lab Results  Component Value Date   CHOL 144 04/20/2022   HDL 39.70 04/20/2022   LDLCALC 90 04/20/2022   TRIG 75.0 04/20/2022   CHOLHDL 4 04/20/2022   Lab Results  Component Value Date   HGBA1C 5.5 04/20/2022   No results found for: "VITAMINB12" Lab  Results  Component Value Date   TSH 0.855 09/24/2019    ASSESSMENT AND PLAN  32 y.o. year old male  has no past medical history on file. here with   OSA on CPAP  Brad is doing well, today. Compliance report shows excellent daily and 4 hour compliance. He was encouraged to continue CPAP nightly and greater than 4 hours each night. Supply orders updated. BP was elevated, today. I have encouraged him to limit salt in diet and contact PCP for CPE and discussion of BP.  Continue working on [employer]. Healthy lifestyle habits encouraged. He will follow up with me in 1 year. He verbalizes understanding and agreement with this plan.    Shawnie Dapper, MSN, FNP-C 04/18/2023, 2:06 PM  [facility] [address] [place], [place] [address] [phone]

## 2023-04-18 NOTE — Patient Instructions (Signed)

## 2024-04-21 NOTE — Patient Instructions (Incomplete)

## 2024-04-21 NOTE — Progress Notes (Unsigned)
 No chief complaint on file.   HISTORY OF PRESENT ILLNESS:  04/21/24 ALL: Ryan Kramer returns for follow up for OSA on CPAP. He continues to do well.  04/18/2023 ALL:  Ryan Kramer returns for follow up for OSA on CPAP. He continues to do well. He is using therapy nightly for about 8 hours. He continues to do well on therapy. He is using CPAP most every night. He does feel better when using therapy. He reports losing about 27lbs in the past three months. He has returned to the gym but admits that he hasn't gone recently. Home BP was 142/86 last week. He was started on HTN agent last year but didn't like the way it made him feel. He has follow up scheduled soon.     04/17/2022 ALL: Ryan Kramer returns for follow up for OSA on CPAP. He continues to do well. He is using CPAP nightly for about 8 hours. He denies concerns with machine or supplies. He is unsure of BP readings at home. He is due for CPE with PCP. He is asymptomatic, today.     04/06/2021 ALL: Ryan Kramer returns for follow up on headaches and OSA on CPAP. He has resumed CPAP therapy. He is doing well on therapy. He feels that he is sleeping better. Headaches have resolved. He denies concerns with CPAP or supplies.      10/06/2020 ALL:  Ryan Kramer is a 33 y.o. male here today for follow up for headaches. He has discontinued amitriptyline . He did not feel it was needed. He has not had any recent headaches. He does note some tension if he doesn't drink water. EEG was normal. No seizure like activity. He has not used CPAP in at least 2 months. He has felt well until the past week. He traveled to [place]  and when he got back he had some neck tightness and his ears are popping. He feels this is related to traveling. No fevers or pain with movement of neck. No other signs of infection. He is feeling well and without complaints. He would ike to return to work and needs neurological clearance. He is followed by PCP regularly.    HISTORY (copied from  my note on 04/13/2020)  Ryan Kramer is a 33 y.o. male here today for follow up for headaches. He did start amitriptyline . He titrated dose to 50mg  at bedtime. He feels that headaches are better. He does continue to have intermittent headaches (1-2 weekly), neck stiffness and right eye weakness. Eye weakness is described as a "tired feeling" with blurry vision in the evenings. No drooping of eye lid. No vision loss. He feels off balance at times. He endorses intermittent, random, generalized numbness and tingling. No obvious seizure activity. MRI was normal. Mom is asking to proceed with EEG. He tried stopping all medications to see how he felt but states that after about a week, symptoms worsened. When he resumed meds, symptoms improved. He has not seen ophthalmology. He reports having appt next week. BP is elevated today (141/103). His mom usually checks BP at home but he is unclear about readings. He thinks that last week it was 133/87. He reports using CPAP every night. He denies any concerns with CPAP machine or supplies. Compliance report dated 03/13/2020-04/11/2020 reveals that he has used CPAP 2 of the past 30 days. There was a significant leak in the 95th percentile of 41.4L/min.    HISTORY: (copied from Dr Omar Bibber note on 03/10/2020)   Ryan Kramer is a 33 year old  right-handed gentleman with an underlying benign medical history other than morbid obesity with a BMI of over 45, who presents for follow-up consultation of his obstructive sleep apnea and also recent emergency room visit for increase in headaches and diagnosis of viral meningitis on 02/22/20.  He is accompanied by his mother today.  He missed an appointment for sleep apnea follow-up in January 2021. He missed an appointment on 02/26/20.  I first met him on 10/06/2019 at the request of his primary care physician, at which time the patient reported intermittent headaches, snoring and daytime somnolence.  He was advised to proceed with a sleep  study.  He had a baseline sleep study on 11/01/2019 and I reviewed the results: mild obstructive sleep apnea, moderate during supine sleep, severe in REM sleep with a total AHI of 13.8/hour, REM AHI of 36.7/hour, supine AHI of 17.9/hour, and O2 nadir of 83%. We called him with his test results and he was advised to start AutoPap therapy due to his history of headaches and daytime somnolence reported.   He recently presented to the emergency room on 02/21/2020 with a 2-week history of headaches and he started having numbness in his extremities.  He was treated symptomatically with a migraine cocktail.  He had multiple imaging tests.  He had a head CT without contrast on 02/21/2020 and I reviewed the results: IMPRESSION: No acute intracranial findings.   Symmetric prominence of the bilateral parotid glands, a nonspecific finding. 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 This is a typically reversible entity with a wide differential including medications (primarily anti-seizure), infection (no additional imaging evidence of this), metabolic disease, and some associations such as vaccination. A repeat study in 2-4 weeks could be considered.  Normal vascular imaging.   He had a neurology consultation in the emergency room on 02/22/2020 and reported neck stiffness.  He had a lumbar puncture which showed an opening pressure of 32 cm, elevated protein and elevated white cell count with lymphocytic predominance.   He was diagnosed with viral meningitis.   He presented to the emergency room on 02/23/2020 with ongoing headaches.  He was treated with IV hydration, Decadron , fentanyl  and droperidol .   He had a telemedicine follow-up appointment with his primary  care physician on 02/24/2020 and reported feeling improved.   CSF culture was negative.  HSV PCR for HSV 1 and 2 was negative in the CSF.   Today, 03/10/2020: I reviewed his AutoPap compliance data from 02/03/20 through 03/03/20, which is a total of 30 days, during which time he used his autoPAP 2 days.    He reports a generalized headache.  His mother provides most of his history.  She reports that he has had nausea, he is in bed most of the day.  She reports that he has taken Tylenol  and Motrin  with little relief.  He reports that he stopped using his AutoPap.  He did not have any problems tolerating the pressure of the mask.  He indicates that he has a full facemask.  He would be willing to restart his AutoPap.  His mother would like for him to have something for pain.  I explained to her that at the most we can provide a Toradol  injection in the  office but she also reported that he had been taking 600 to 800 mg of ibuprofen  and 2 extra strength Tylenol  alternating, which is why I did not feel comfortable adding yet another nonsteroidal anti-inflammatory medication by injection in the office today.  She did request a prescription for his nausea medicine, he had been given a prescription for Zofran  which I reviewed. He denies any one-sided weakness or numbness or tingling. She is requesting a brain MRI with contrast.  I explained to her that I would be happy to order a repeat brain MRI with and without contrast but in the outpatient setting it is always slower even with an urgent request than in the inpatient setting or ER setting.  He drinks about 5 or 6 cups of water per day, he does not drink any sodas or caffeine currently.  He is not sleeping very well.  His mother reports that he has not slept well in the past 3 to 4 weeks.   Previously:    10/06/2019: (He) reports snoring and excessive daytime somnolence, intermittent headaches.  I reviewed your office note from 09/24/2019.  He was recently tested for  COVID-19 and tested negative.  His blood pressure is elevated today but was noted to be normal at his visit with you.  He reports difficulty initiating and maintaining sleep for the past 2 years.  His snoring has been noted by his uncle and his mother.  He lives with his mother, paternal uncle has sleep apnea and uses a CPAP machine.  Patient reports that he has been told he has stops in his breathing.  He also endorses restless leg symptoms and kicking in his sleep.  He has a TV in the bedroom and does not always leave it on at night but sometimes it stays on.  He has no pets in the household.  He estimates that he gets about 4 to 5 hours of sleep on any given night.  He has tried melatonin which did not help.  He does not currently drink any caffeine.  He has reduced his alcohol intake and reports that he was drinking quite heavily.  He is now drinking approximately half a bottle of liquor once or twice a week.  He used to drink daily.  He smokes occasional cigars, no cigarettes.  He is trying to lose weight and has lost some weight in the past 2 months but in the past year, he had gained about 80 pounds.  Bedtime is generally between 10 and 10:30 PM and rise time is between 6 and 6:30 AM.  He has had rare morning headaches, he has nocturia about 2-4 times per average night.  Epworth sleepiness score is 9 out of 24, fatigue severity score is 43 out of 63.    REVIEW OF SYSTEMS: Out of a complete 14 system review of symptoms, the patient complains only of the following symptoms, fatigue and all other reviewed systems are negative.  ESS: 4/24   ALLERGIES: No Known Allergies   HOME MEDICATIONS: Outpatient Medications Prior to Visit  Medication Sig Dispense Refill   amitriptyline  (ELAVIL ) 25 MG tablet 1/2 TAB AT BEDTIME X1 WEEK, 1 NIGHTLY X1 WEEK, 1&1/2 NIGHTLY X1 WEEK, THEN 2 NIGHTLY THEREAFTER. (Patient not taking: Reported on 04/18/2023)     valsartan -hydrochlorothiazide  (DIOVAN -HCT) 160-12.5 MG  tablet Take 1 tablet by mouth daily. (Patient not taking: Reported on 04/18/2023) 90 tablet 3   Vitamin D , Ergocalciferol , (DRISDOL ) 1.25 MG (50000 UNIT) CAPS capsule Take 1 capsule (50,000  Units total) by mouth every 7 (seven) days. 7 capsule 1   No facility-administered medications prior to visit.     PAST MEDICAL HISTORY: No past medical history on file.   PAST SURGICAL HISTORY: Past Surgical History:  Procedure Laterality Date   SHOULDER SURGERY       FAMILY HISTORY: Family History  Problem Relation Age of Onset   Hypertension Mother    Hypertension Father      SOCIAL HISTORY: Social History   Socioeconomic History   Marital status: Single    Spouse name: Not on file   Number of children: Not on file   Years of education: Not on file   Highest education level: Not on file  Occupational History   Not on file  Tobacco Use   Smoking status: Every Day    Types: Cigars   Smokeless tobacco: Never   Tobacco comments:    once a week  Vaping Use   Vaping status: Never Used  Substance and Sexual Activity   Alcohol use: Yes    Comment: 2-3 drinks a week   Drug use: No   Sexual activity: Not on file  Other Topics Concern   Not on file  Social History Narrative   Right and Left Handed   No Caffeine Use    Social Drivers of Health   Financial Resource Strain: Not on file  Food Insecurity: Not on file  Transportation Needs: Not on file  Physical Activity: Not on file  Stress: Not on file  Social Connections: Not on file  Intimate Partner Violence: Not on file      PHYSICAL EXAM  There were no vitals filed for this visit.    There is no height or weight on file to calculate BMI.   Generalized: Well developed, in no acute distress   Neurological examination  Mentation: Alert oriented to time, place, history taking. Follows all commands speech and language fluent Cranial nerve II-XII: Pupils were equal round reactive to light. Extraocular movements  were full, visual field were full on confrontational test. Facial sensation and strength were normal. Head turning and shoulder shrug  were normal and symmetric. Motor: The motor testing reveals 5 over 5 strength of all 4 extremities. Good symmetric motor tone is noted throughout.  Sensory: Sensory testing is intact to soft touch on all 4 extremities. No evidence of extinction is noted.  Coordination: Cerebellar testing reveals good finger-nose-finger and heel-to-shin bilaterally.  Gait and station: Gait is normal.    DIAGNOSTIC DATA (LABS, IMAGING, TESTING) - I reviewed patient records, labs, notes, testing and imaging myself where available.  Lab Results  Component Value Date   WBC 13.2 (H) 04/20/2022   HGB 14.5 04/20/2022   HCT 44.8 04/20/2022   MCV 92.3 04/20/2022   PLT 236.0 04/20/2022      Component Value Date/Time   NA 137 04/20/2022 0922   NA 137 03/10/2020 0925   K 4.2 04/20/2022 0922   CL 104 04/20/2022 0922   CO2 26 04/20/2022 0922   GLUCOSE 101 (H) 04/20/2022 0922   BUN 13 04/20/2022 0922   BUN 11 03/10/2020 0925   CREATININE 1.14 04/20/2022 0922   CALCIUM 9.0 04/20/2022 0922   PROT 7.4 04/20/2022 0922   PROT 7.5 03/10/2020 0925   ALBUMIN 4.0 04/20/2022 0922   ALBUMIN 4.4 03/10/2020 0925   AST 16 04/20/2022 0922   ALT 26 04/20/2022 0922   ALKPHOS 83 04/20/2022 0922   BILITOT 0.6 04/20/2022 1610  BILITOT 0.6 03/10/2020 0925   GFRNONAA 85 03/10/2020 0925   GFRAA 98 03/10/2020 0925   Lab Results  Component Value Date   CHOL 144 04/20/2022   HDL 39.70 04/20/2022   LDLCALC 90 04/20/2022   TRIG 75.0 04/20/2022   CHOLHDL 4 04/20/2022   Lab Results  Component Value Date   HGBA1C 5.5 04/20/2022   No results found for: "VITAMINB12" Lab Results  Component Value Date   TSH 0.855 09/24/2019    ASSESSMENT AND PLAN  32 y.o. year old male  has no past medical history on file. here with   No diagnosis found.  Aravind is doing well, today. Compliance report  shows excellent daily and 4 hour compliance. He was encouraged to continue CPAP nightly and greater than 4 hours each night. Supply orders updated. BP was elevated, today. I have encouraged him to limit salt in diet and contact PCP for CPE and discussion of BP.  Continue working on [employer]. Healthy lifestyle habits encouraged. He will follow up with me in 1 year. He verbalizes understanding and agreement with this plan.    Terrilyn Fick, MSN, FNP-C 04/21/2024, 4:27 PM  [facility] [address], [address] [place], [place] [address] [phone]

## 2024-04-22 NOTE — Progress Notes (Unsigned)
 Ryan Kramer

## 2024-04-23 ENCOUNTER — Encounter: Payer: Self-pay | Admitting: Family Medicine

## 2024-04-23 ENCOUNTER — Ambulatory Visit (INDEPENDENT_AMBULATORY_CARE_PROVIDER_SITE_OTHER): Payer: Self-pay | Admitting: Family Medicine

## 2024-04-23 VITALS — BP 140/60 | HR 98 | Ht 72.0 in | Wt 393.0 lb

## 2024-04-23 DIAGNOSIS — G4733 Obstructive sleep apnea (adult) (pediatric): Secondary | ICD-10-CM

## 2024-04-24 ENCOUNTER — Encounter: Payer: Self-pay | Admitting: Emergency Medicine

## 2024-04-24 ENCOUNTER — Ambulatory Visit: Payer: Self-pay | Admitting: Emergency Medicine

## 2024-04-24 VITALS — BP 152/90 | HR 89 | Temp 98.7°F | Ht 72.0 in | Wt 393.0 lb

## 2024-04-24 DIAGNOSIS — I10 Essential (primary) hypertension: Secondary | ICD-10-CM

## 2024-04-24 LAB — LIPID PANEL
Cholesterol: 152 mg/dL (ref 0–200)
HDL: 41.2 mg/dL (ref >39.00)
LDL Cholesterol: 87 mg/dL (ref 0–99)
NonHDL: 110.65
Total CHOL/HDL Ratio: 4
Triglycerides: 117 mg/dL (ref 0.0–149.0)
VLDL: 23.4 mg/dL (ref 0.0–40.0)

## 2024-04-24 LAB — CBC WITH DIFFERENTIAL/PLATELET
Basophils Absolute: 0.1 10*3/uL (ref 0.0–0.1)
Basophils Relative: 0.6 % (ref 0.0–3.0)
Eosinophils Absolute: 0.3 10*3/uL (ref 0.0–0.7)
Eosinophils Relative: 1.8 % (ref 0.0–5.0)
HCT: 42.7 % (ref 39.0–52.0)
Hemoglobin: 13.9 g/dL (ref 13.0–17.0)
Lymphocytes Relative: 23.2 % (ref 12.0–46.0)
Lymphs Abs: 3.7 10*3/uL (ref 0.7–4.0)
MCHC: 32.6 g/dL (ref 30.0–36.0)
MCV: 94 fl (ref 78.0–100.0)
Monocytes Absolute: 1.9 10*3/uL — ABNORMAL HIGH (ref 0.1–1.0)
Monocytes Relative: 11.5 % (ref 3.0–12.0)
Neutro Abs: 10.1 10*3/uL — ABNORMAL HIGH (ref 1.4–7.7)
Neutrophils Relative %: 62.9 % (ref 43.0–77.0)
Platelets: 284 10*3/uL (ref 150.0–400.0)
RBC: 4.54 Mil/uL (ref 4.22–5.81)
RDW: 14.8 % (ref 11.5–15.5)
WBC: 16.1 10*3/uL — ABNORMAL HIGH (ref 4.0–10.5)

## 2024-04-24 LAB — COMPREHENSIVE METABOLIC PANEL WITH GFR
ALT: 28 U/L (ref 0–53)
AST: 17 U/L (ref 0–37)
Albumin: 3.9 g/dL (ref 3.5–5.2)
Alkaline Phosphatase: 75 U/L (ref 39–117)
BUN: 22 mg/dL (ref 6–23)
CO2: 25 meq/L (ref 19–32)
Calcium: 9.1 mg/dL (ref 8.4–10.5)
Chloride: 103 meq/L (ref 96–112)
Creatinine, Ser: 1.22 mg/dL (ref 0.40–1.50)
GFR: 77.26 mL/min (ref >60.00)
Glucose, Bld: 96 mg/dL (ref 70–99)
Potassium: 3.8 meq/L (ref 3.5–5.1)
Sodium: 137 meq/L (ref 135–145)
Total Bilirubin: 0.3 mg/dL (ref 0.2–1.2)
Total Protein: 7.5 g/dL (ref 6.0–8.3)

## 2024-04-24 LAB — VITAMIN D 25 HYDROXY (VIT D DEFICIENCY, FRACTURES): VITD: 20.68 ng/mL — ABNORMAL LOW (ref 30.00–100.00)

## 2024-04-24 LAB — HEMOGLOBIN A1C: Hgb A1c MFr Bld: 5.7 % (ref 4.6–6.5)

## 2024-04-24 LAB — VITAMIN B12: Vitamin B-12: 125 pg/mL — ABNORMAL LOW (ref 211–911)

## 2024-04-24 LAB — TSH: TSH: 0.59 u[IU]/mL (ref 0.35–5.50)

## 2024-04-24 MED ORDER — VALSARTAN-HYDROCHLOROTHIAZIDE 80-12.5 MG PO TABS
1.0000 | ORAL_TABLET | Freq: Every day | ORAL | 3 refills | Status: AC
Start: 2024-04-24 — End: ?

## 2024-04-24 NOTE — Progress Notes (Signed)
 Ryan Kramer 33 y.o.   Chief Complaint  Patient presents with   Annual Exam    Patient here for physical. Patient states a month ago when him was on the track his legs does go numb it switches from left to right leg and has been happen more so this last month.     HISTORY OF PRESENT ILLNESS: This is a 33 y.o. male here for annual exam and follow-up of chronic medical conditions. History of hypertension.  Not taking medication History of obstructive sleep apnea compliant with CPAP machine.  Was able to follow-up with sleep clinic doctor yesterday.  Doing well. History of morbid obesity.  Noncompliant with diet and nutrition. No other complaints or medical concerns today. Wt Readings from Last 3 Encounters:  04/24/24 (!) 393 lb (178.3 kg)  04/23/24 (!) 393 lb (178.3 kg)  04/18/23 (!) 376 lb 8 oz (170.8 kg)     HPI   Prior to Admission medications   Medication Sig Start Date End Date Taking? Authorizing Provider  valsartan -hydrochlorothiazide  (DIOVAN -HCT) 160-12.5 MG tablet Take 1 tablet by mouth daily. Patient not taking: Reported on 04/18/2023 04/20/22   Adele Milson Jose, MD  Vitamin D , Ergocalciferol , (DRISDOL ) 1.25 MG (50000 UNIT) CAPS capsule Take 1 capsule (50,000 Units total) by mouth every 7 (seven) days. Patient not taking: Reported on 04/24/2024 05/01/22   Elvira Hammersmith, MD    No Known Allergies  Patient Active Problem List   Diagnosis Date Noted   Uncontrolled hypertension 04/20/2022   Morbid obesity (HCC) 04/20/2022    No past medical history on file.  Past Surgical History:  Procedure Laterality Date   SHOULDER SURGERY      Social History   Socioeconomic History   Marital status: Single    Spouse name: Not on file   Number of children: Not on file   Years of education: Not on file   Highest education level: Not on file  Occupational History   Not on file  Tobacco Use   Smoking status: Every Day    Types: Cigars   Smokeless tobacco:  Never   Tobacco comments:    once a week  Vaping Use   Vaping status: Never Used  Substance and Sexual Activity   Alcohol use: Yes    Comment: 2-3 drinks a week   Drug use: No   Sexual activity: Not on file  Other Topics Concern   Not on file  Social History Narrative   Right and Left Handed   No Caffeine Use    Social Drivers of Health   Financial Resource Strain: Not on file  Food Insecurity: Not on file  Transportation Needs: Not on file  Physical Activity: Not on file  Stress: Not on file  Social Connections: Not on file  Intimate Partner Violence: Not on file    Family History  Problem Relation Age of Onset   Hypertension Mother    Hypertension Father      Review of Systems  Constitutional: Negative.  Negative for chills and fever.  HENT: Negative.  Negative for congestion and sore throat.   Respiratory: Negative.  Negative for cough and shortness of breath.   Cardiovascular: Negative.  Negative for chest pain and palpitations.  Gastrointestinal:  Negative for abdominal pain, diarrhea, nausea and vomiting.  Genitourinary: Negative.  Negative for dysuria and hematuria.  Skin: Negative.  Negative for rash.  Neurological: Negative.  Negative for dizziness and headaches.  All other systems reviewed and are negative.  Vitals:   04/24/24 1448  BP: (!) 152/90  Pulse: 89  Temp: 98.7 F (37.1 C)  SpO2: 97%    Physical Exam Vitals reviewed.  Constitutional:      Appearance: Normal appearance. He is obese.  HENT:     Head: Normocephalic.     Mouth/Throat:     Mouth: Mucous membranes are moist.     Pharynx: Oropharynx is clear.  Eyes:     Extraocular Movements: Extraocular movements intact.     Conjunctiva/sclera: Conjunctivae normal.     Pupils: Pupils are equal, round, and reactive to light.  Cardiovascular:     Rate and Rhythm: Normal rate and regular rhythm.     Pulses: Normal pulses.     Heart sounds: Normal heart sounds.  Pulmonary:     Effort:  Pulmonary effort is normal.     Breath sounds: Normal breath sounds.  Musculoskeletal:     Cervical back: No tenderness.  Lymphadenopathy:     Cervical: No cervical adenopathy.  Skin:    General: Skin is warm and dry.     Capillary Refill: Capillary refill takes less than 2 seconds.  Neurological:     General: No focal deficit present.     Mental Status: He is alert and oriented to person, place, and time.  Psychiatric:        Mood and Affect: Mood normal.        Behavior: Behavior normal.      ASSESSMENT & PLAN:  Problem List Items Addressed This Visit       Cardiovascular and Mediastinum   Essential hypertension - Primary   BP Readings from Last 3 Encounters:  04/24/24 (!) 152/90  04/23/24 (!) 140/60  04/18/23 (!) 151/98  Cardiovascular risks associated with uncontrolled hypertension discussed States he felt "loopy" when he started valsartan  HCT 160-12.5 mg daily Only took medication for 1 week Dietary approaches to stop hypertension discussed Recommend to restart valsartan  HCT at a lower dose 80-12.5 mg daily Recommend follow-up in 3 months Blood work done today       Relevant Medications   valsartan -hydrochlorothiazide  (DIOVAN -HCT) 80-12.5 MG tablet   Other Relevant Orders   CBC with Differential/Platelet   Comprehensive metabolic panel with GFR   Hemoglobin A1c   Lipid panel   TSH     Other   Morbid obesity (HCC)   Diet and nutrition discussed.  Advised to reduce the amount of daily carbohydrate intake and decrease number of daily calories ingested. Cardiovascular risks associated with obesity discussed Not interested in medical weight management evaluation Not interested in GLP-1 agonist medications      Relevant Orders   CBC with Differential/Platelet   Comprehensive metabolic panel with GFR   Hemoglobin A1c   Lipid panel   TSH   Patient Instructions  Hypertension, Adult High blood pressure (hypertension) is when the force of blood pumping  through the arteries is too strong. The arteries are the blood vessels that carry blood from the heart throughout the body. Hypertension forces the heart to work harder to pump blood and may cause arteries to become narrow or stiff. Untreated or uncontrolled hypertension can lead to a heart attack, heart failure, a stroke, kidney disease, and other problems. A blood pressure reading consists of a higher number over a lower number. Ideally, your blood pressure should be below 120/80. The first ("top") number is called the systolic pressure. It is a measure of the pressure in your arteries as your heart beats. The second ("  bottom") number is called the diastolic pressure. It is a measure of the pressure in your arteries as the heart relaxes. What are the causes? The exact cause of this condition is not known. There are some conditions that result in high blood pressure. What increases the risk? Certain factors may make you more likely to develop high blood pressure. Some of these risk factors are under your control, including: Smoking. Not getting enough exercise or physical activity. Being overweight. Having too much fat, sugar, calories, or salt (sodium) in your diet. Drinking too much alcohol. Other risk factors include: Having a personal history of heart disease, diabetes, high cholesterol, or kidney disease. Stress. Having a family history of high blood pressure and high cholesterol. Having obstructive sleep apnea. Age. The risk increases with age. What are the signs or symptoms? High blood pressure may not cause symptoms. Very high blood pressure (hypertensive crisis) may cause: Headache. Fast or irregular heartbeats (palpitations). Shortness of breath. Nosebleed. Nausea and vomiting. Vision changes. Severe chest pain, dizziness, and seizures. How is this diagnosed? This condition is diagnosed by measuring your blood pressure while you are seated, with your arm resting on a flat  surface, your legs uncrossed, and your feet flat on the floor. The cuff of the blood pressure monitor will be placed directly against the skin of your upper arm at the level of your heart. Blood pressure should be measured at least twice using the same arm. Certain conditions can cause a difference in blood pressure between your right and left arms. If you have a high blood pressure reading during one visit or you have normal blood pressure with other risk factors, you may be asked to: Return on a different day to have your blood pressure checked again. Monitor your blood pressure at home for 1 week or longer. If you are diagnosed with hypertension, you may have other blood or imaging tests to help your health care provider understand your overall risk for other conditions. How is this treated? This condition is treated by making healthy lifestyle changes, such as eating healthy foods, exercising more, and reducing your alcohol intake. You may be referred for counseling on a healthy diet and physical activity. Your health care provider may prescribe medicine if lifestyle changes are not enough to get your blood pressure under control and if: Your systolic blood pressure is above 130. Your diastolic blood pressure is above 80. Your personal target blood pressure may vary depending on your medical conditions, your age, and other factors. Follow these instructions at home: Eating and drinking  Eat a diet that is high in fiber and potassium, and low in sodium, added sugar, and fat. An example of this eating plan is called the DASH diet. DASH stands for Dietary Approaches to Stop Hypertension. To eat this way: Eat plenty of fresh fruits and vegetables. Try to fill one half of your plate at each meal with fruits and vegetables. Eat whole grains, such as whole-wheat pasta, brown rice, or whole-grain bread. Fill about one fourth of your plate with whole grains. Eat or drink low-fat dairy products, such as  skim milk or low-fat yogurt. Avoid fatty cuts of meat, processed or cured meats, and poultry with skin. Fill about one fourth of your plate with lean proteins, such as fish, chicken without skin, beans, eggs, or tofu. Avoid pre-made and processed foods. These tend to be higher in sodium, added sugar, and fat. Reduce your daily sodium intake. Many people with hypertension should eat less  than 1,500 mg of sodium a day. Do not drink alcohol if: Your health care provider tells you not to drink. You are pregnant, may be pregnant, or are planning to become pregnant. If you drink alcohol: Limit how much you have to: 0-1 drink a day for women. 0-2 drinks a day for men. Know how much alcohol is in your drink. In the [place] one drink equals one 12 oz bottle of beer (355 mL), one 5 oz glass of wine (148 mL), or one 1 oz glass of hard liquor (44 mL). Lifestyle  Work with your health care provider to maintain a healthy body weight or to lose weight. Ask what an ideal weight is for you. Get at least 30 minutes of exercise that causes your heart to beat faster (aerobic exercise) most days of the week. Activities may include walking, swimming, or biking. Include exercise to strengthen your muscles (resistance exercise), such as Pilates or lifting weights, as part of your weekly exercise routine. Try to do these types of exercises for 30 minutes at least 3 days a week. Do not use any products that contain nicotine or tobacco. These products include cigarettes, chewing tobacco, and vaping devices, such as e-cigarettes. If you need help quitting, ask your health care provider. Monitor your blood pressure at home as told by your health care provider. Keep all follow-up visits. This is important. Medicines Take over-the-counter and prescription medicines only as told by your health care provider. Follow directions carefully. Blood pressure medicines must be taken as prescribed. Do not skip doses of blood pressure  medicine. Doing this puts you at risk for problems and can make the medicine less effective. Ask your health care provider about side effects or reactions to medicines that you should watch for. Contact a health care provider if you: Think you are having a reaction to a medicine you are taking. Have headaches that keep coming back (recurring). Feel dizzy. Have swelling in your ankles. Have trouble with your vision. Get help right away if you: Develop a severe headache or confusion. Have unusual weakness or numbness. Feel faint. Have severe pain in your chest or abdomen. Vomit repeatedly. Have trouble breathing. These symptoms may be an emergency. Get help right away. Call 911. Do not wait to see if the symptoms will go away. Do not drive yourself to the hospital. Summary Hypertension is when the force of blood pumping through your arteries is too strong. If this condition is not controlled, it may put you at risk for serious complications. Your personal target blood pressure may vary depending on your medical conditions, your age, and other factors. For most people, a normal blood pressure is less than 120/80. Hypertension is treated with lifestyle changes, medicines, or a combination of both. Lifestyle changes include losing weight, eating a healthy, low-sodium diet, exercising more, and limiting alcohol. This information is not intended to replace advice given to you by your health care provider. Make sure you discuss any questions you have with your health care provider. Document Revised: 10/11/2021 Document Reviewed: 10/11/2021 Elsevier Patient Education  2024 [employer]    Maryagnes Small, MD [facility]

## 2024-04-24 NOTE — Assessment & Plan Note (Signed)
 BP Readings from Last 3 Encounters:  04/24/24 (!) 152/90  04/23/24 (!) 140/60  04/18/23 (!) 151/98  Cardiovascular risks associated with uncontrolled hypertension discussed States he felt "loopy" when he started valsartan  HCT 160-12.5 mg daily Only took medication for 1 week Dietary approaches to stop hypertension discussed Recommend to restart valsartan  HCT at a lower dose 80-12.5 mg daily Recommend follow-up in 3 months Blood work done today

## 2024-04-24 NOTE — Patient Instructions (Signed)
 Hypertension, Adult High blood pressure (hypertension) is when the force of blood pumping through the arteries is too strong. The arteries are the blood vessels that carry blood from the heart throughout the body. Hypertension forces the heart to work harder to pump blood and may cause arteries to become narrow or stiff. Untreated or uncontrolled hypertension can lead to a heart attack, heart failure, a stroke, kidney disease, and other problems. A blood pressure reading consists of a higher number over a lower number. Ideally, your blood pressure should be below 120/80. The first ("top") number is called the systolic pressure. It is a measure of the pressure in your arteries as your heart beats. The second ("bottom") number is called the diastolic pressure. It is a measure of the pressure in your arteries as the heart relaxes. What are the causes? The exact cause of this condition is not known. There are some conditions that result in high blood pressure. What increases the risk? Certain factors may make you more likely to develop high blood pressure. Some of these risk factors are under your control, including: Smoking. Not getting enough exercise or physical activity. Being overweight. Having too much fat, sugar, calories, or salt (sodium) in your diet. Drinking too much alcohol. Other risk factors include: Having a personal history of heart disease, diabetes, high cholesterol, or kidney disease. Stress. Having a family history of high blood pressure and high cholesterol. Having obstructive sleep apnea. Age. The risk increases with age. What are the signs or symptoms? High blood pressure may not cause symptoms. Very high blood pressure (hypertensive crisis) may cause: Headache. Fast or irregular heartbeats (palpitations). Shortness of breath. Nosebleed. Nausea and vomiting. Vision changes. Severe chest pain, dizziness, and seizures. How is this diagnosed? This condition is diagnosed by  measuring your blood pressure while you are seated, with your arm resting on a flat surface, your legs uncrossed, and your feet flat on the floor. The cuff of the blood pressure monitor will be placed directly against the skin of your upper arm at the level of your heart. Blood pressure should be measured at least twice using the same arm. Certain conditions can cause a difference in blood pressure between your right and left arms. If you have a high blood pressure reading during one visit or you have normal blood pressure with other risk factors, you may be asked to: Return on a different day to have your blood pressure checked again. Monitor your blood pressure at home for 1 week or longer. If you are diagnosed with hypertension, you may have other blood or imaging tests to help your health care provider understand your overall risk for other conditions. How is this treated? This condition is treated by making healthy lifestyle changes, such as eating healthy foods, exercising more, and reducing your alcohol intake. You may be referred for counseling on a healthy diet and physical activity. Your health care provider may prescribe medicine if lifestyle changes are not enough to get your blood pressure under control and if: Your systolic blood pressure is above 130. Your diastolic blood pressure is above 80. Your personal target blood pressure may vary depending on your medical conditions, your age, and other factors. Follow these instructions at home: Eating and drinking  Eat a diet that is high in fiber and potassium, and low in sodium, added sugar, and fat. An example of this eating plan is called the DASH diet. DASH stands for Dietary Approaches to Stop Hypertension. To eat this way: Eat  plenty of fresh fruits and vegetables. Try to fill one half of your plate at each meal with fruits and vegetables. Eat whole grains, such as whole-wheat pasta, brown rice, or whole-grain bread. Fill about one  fourth of your plate with whole grains. Eat or drink low-fat dairy products, such as skim milk or low-fat yogurt. Avoid fatty cuts of meat, processed or cured meats, and poultry with skin. Fill about one fourth of your plate with lean proteins, such as fish, chicken without skin, beans, eggs, or tofu. Avoid pre-made and processed foods. These tend to be higher in sodium, added sugar, and fat. Reduce your daily sodium intake. Many people with hypertension should eat less than 1,500 mg of sodium a day. Do not drink alcohol if: Your health care provider tells you not to drink. You are pregnant, may be pregnant, or are planning to become pregnant. If you drink alcohol: Limit how much you have to: 0-1 drink a day for women. 0-2 drinks a day for men. Know how much alcohol is in your drink. In the [place] one drink equals one 12 oz bottle of beer (355 mL), one 5 oz glass of wine (148 mL), or one 1 oz glass of hard liquor (44 mL). Lifestyle  Work with your health care provider to maintain a healthy body weight or to lose weight. Ask what an ideal weight is for you. Get at least 30 minutes of exercise that causes your heart to beat faster (aerobic exercise) most days of the week. Activities may include walking, swimming, or biking. Include exercise to strengthen your muscles (resistance exercise), such as Pilates or lifting weights, as part of your weekly exercise routine. Try to do these types of exercises for 30 minutes at least 3 days a week. Do not use any products that contain nicotine or tobacco. These products include cigarettes, chewing tobacco, and vaping devices, such as e-cigarettes. If you need help quitting, ask your health care provider. Monitor your blood pressure at home as told by your health care provider. Keep all follow-up visits. This is important. Medicines Take over-the-counter and prescription medicines only as told by your health care provider. Follow directions carefully. Blood  pressure medicines must be taken as prescribed. Do not skip doses of blood pressure medicine. Doing this puts you at risk for problems and can make the medicine less effective. Ask your health care provider about side effects or reactions to medicines that you should watch for. Contact a health care provider if you: Think you are having a reaction to a medicine you are taking. Have headaches that keep coming back (recurring). Feel dizzy. Have swelling in your ankles. Have trouble with your vision. Get help right away if you: Develop a severe headache or confusion. Have unusual weakness or numbness. Feel faint. Have severe pain in your chest or abdomen. Vomit repeatedly. Have trouble breathing. These symptoms may be an emergency. Get help right away. Call 911. Do not wait to see if the symptoms will go away. Do not drive yourself to the hospital. Summary Hypertension is when the force of blood pumping through your arteries is too strong. If this condition is not controlled, it may put you at risk for serious complications. Your personal target blood pressure may vary depending on your medical conditions, your age, and other factors. For most people, a normal blood pressure is less than 120/80. Hypertension is treated with lifestyle changes, medicines, or a combination of both. Lifestyle changes include losing weight, eating a healthy,  low-sodium diet, exercising more, and limiting alcohol. This information is not intended to replace advice given to you by your health care provider. Make sure you discuss any questions you have with your health care provider. Document Revised: 10/11/2021 Document Reviewed: 10/11/2021 Elsevier Patient Education  2024 [employer].

## 2024-04-24 NOTE — Assessment & Plan Note (Signed)
 Diet and nutrition discussed.  Advised to reduce the amount of daily carbohydrate intake and decrease number of daily calories ingested. Cardiovascular risks associated with obesity discussed Not interested in medical weight management evaluation Not interested in GLP-1 agonist medications

## 2024-07-28 ENCOUNTER — Ambulatory Visit: Payer: Self-pay | Admitting: Emergency Medicine

## 2024-09-25 ENCOUNTER — Ambulatory Visit: Payer: Self-pay | Admitting: Emergency Medicine

## 2024-10-16 ENCOUNTER — Ambulatory Visit: Payer: Self-pay | Admitting: Emergency Medicine

## 2025-04-29 ENCOUNTER — Ambulatory Visit: Payer: Self-pay | Admitting: Neurology

## 2025-04-30 ENCOUNTER — Ambulatory Visit: Payer: Self-pay | Admitting: Family Medicine
